# Patient Record
Sex: Female | Born: 1990 | Hispanic: Yes | Marital: Married | Smoking: Never smoker
Health system: Southern US, Community
[De-identification: ages and names within clinical notes are randomized; demographics above are authoritative.]

## PROBLEM LIST (undated history)

## (undated) DIAGNOSIS — F32A Depression, unspecified: Secondary | ICD-10-CM

## (undated) DIAGNOSIS — O009 Unspecified ectopic pregnancy without intrauterine pregnancy: Secondary | ICD-10-CM

## (undated) DIAGNOSIS — N801 Endometriosis of ovary: Secondary | ICD-10-CM

## (undated) DIAGNOSIS — T4145XA Adverse effect of unspecified anesthetic, initial encounter: Secondary | ICD-10-CM

## (undated) DIAGNOSIS — T8859XA Other complications of anesthesia, initial encounter: Secondary | ICD-10-CM

## (undated) DIAGNOSIS — K219 Gastro-esophageal reflux disease without esophagitis: Secondary | ICD-10-CM

## (undated) DIAGNOSIS — D649 Anemia, unspecified: Secondary | ICD-10-CM

## (undated) DIAGNOSIS — Z9889 Other specified postprocedural states: Secondary | ICD-10-CM

## (undated) DIAGNOSIS — N80109 Endometriosis of ovary, unspecified side, unspecified depth: Secondary | ICD-10-CM

## (undated) DIAGNOSIS — N809 Endometriosis, unspecified: Secondary | ICD-10-CM

## (undated) DIAGNOSIS — M419 Scoliosis, unspecified: Secondary | ICD-10-CM

## (undated) HISTORY — DX: Endometriosis of ovary: N80.1

## (undated) HISTORY — DX: Endometriosis of ovary, unspecified side, unspecified depth: N80.109

## (undated) HISTORY — DX: Gastro-esophageal reflux disease without esophagitis: K21.9

---
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
## 2015-09-01 HISTORY — PX: LAPAROTOMY: SHX154

## 2015-12-22 DIAGNOSIS — N979 Female infertility, unspecified: Secondary | ICD-10-CM | POA: Insufficient documentation

## 2016-02-01 HISTORY — PX: ECTOPIC PREGNANCY SURGERY: SHX613

## 2016-02-11 DIAGNOSIS — O009 Unspecified ectopic pregnancy without intrauterine pregnancy: Secondary | ICD-10-CM | POA: Insufficient documentation

## 2016-08-31 HISTORY — PX: LAPAROTOMY: SHX154

## 2017-03-07 DIAGNOSIS — F3341 Major depressive disorder, recurrent, in partial remission: Secondary | ICD-10-CM | POA: Insufficient documentation

## 2017-03-07 DIAGNOSIS — F325 Major depressive disorder, single episode, in full remission: Secondary | ICD-10-CM | POA: Insufficient documentation

## 2017-03-14 DIAGNOSIS — K219 Gastro-esophageal reflux disease without esophagitis: Secondary | ICD-10-CM | POA: Insufficient documentation

## 2017-06-01 ENCOUNTER — Other Ambulatory Visit: Payer: Self-pay

## 2017-06-01 ENCOUNTER — Encounter: Payer: Self-pay | Admitting: Emergency Medicine

## 2017-06-01 ENCOUNTER — Emergency Department: Payer: Medicaid Other

## 2017-06-01 DIAGNOSIS — N809 Endometriosis, unspecified: Secondary | ICD-10-CM | POA: Diagnosis not present

## 2017-06-01 DIAGNOSIS — Z79899 Other long term (current) drug therapy: Secondary | ICD-10-CM | POA: Insufficient documentation

## 2017-06-01 DIAGNOSIS — K219 Gastro-esophageal reflux disease without esophagitis: Secondary | ICD-10-CM | POA: Diagnosis not present

## 2017-06-01 DIAGNOSIS — R102 Pelvic and perineal pain: Secondary | ICD-10-CM | POA: Insufficient documentation

## 2017-06-01 DIAGNOSIS — O008 Other ectopic pregnancy without intrauterine pregnancy: Secondary | ICD-10-CM | POA: Diagnosis not present

## 2017-06-01 DIAGNOSIS — O209 Hemorrhage in early pregnancy, unspecified: Secondary | ICD-10-CM | POA: Diagnosis present

## 2017-06-01 LAB — CBC WITH DIFFERENTIAL/PLATELET
BASOS ABS: 0 10*3/uL (ref 0–0.1)
BASOS PCT: 1 %
Eosinophils Absolute: 0.2 10*3/uL (ref 0–0.7)
Eosinophils Relative: 2 %
HEMATOCRIT: 35.7 % (ref 35.0–47.0)
Hemoglobin: 11.9 g/dL — ABNORMAL LOW (ref 12.0–16.0)
Lymphocytes Relative: 36 %
Lymphs Abs: 2.9 10*3/uL (ref 1.0–3.6)
MCH: 28.5 pg (ref 26.0–34.0)
MCHC: 33.3 g/dL (ref 32.0–36.0)
MCV: 85.6 fL (ref 80.0–100.0)
Monocytes Absolute: 0.6 10*3/uL (ref 0.2–0.9)
Monocytes Relative: 7 %
NEUTROS ABS: 4.4 10*3/uL (ref 1.4–6.5)
NEUTROS PCT: 54 %
PLATELETS: 323 10*3/uL (ref 150–440)
RBC: 4.17 MIL/uL (ref 3.80–5.20)
RDW: 14.7 % — AB (ref 11.5–14.5)
WBC: 8.1 10*3/uL (ref 3.6–11.0)

## 2017-06-01 LAB — URINALYSIS, COMPLETE (UACMP) WITH MICROSCOPIC
Bacteria, UA: NONE SEEN
Bilirubin Urine: NEGATIVE
GLUCOSE, UA: NEGATIVE mg/dL
HGB URINE DIPSTICK: NEGATIVE
Ketones, ur: NEGATIVE mg/dL
LEUKOCYTES UA: NEGATIVE
NITRITE: NEGATIVE
Protein, ur: NEGATIVE mg/dL
SPECIFIC GRAVITY, URINE: 1.013 (ref 1.005–1.030)
pH: 6 (ref 5.0–8.0)

## 2017-06-01 LAB — BASIC METABOLIC PANEL
ANION GAP: 7 (ref 5–15)
BUN: 8 mg/dL (ref 6–20)
CALCIUM: 8.9 mg/dL (ref 8.9–10.3)
CO2: 26 mmol/L (ref 22–32)
Chloride: 104 mmol/L (ref 101–111)
Creatinine, Ser: 0.43 mg/dL — ABNORMAL LOW (ref 0.44–1.00)
GLUCOSE: 97 mg/dL (ref 65–99)
POTASSIUM: 3.3 mmol/L — AB (ref 3.5–5.1)
SODIUM: 137 mmol/L (ref 135–145)

## 2017-06-01 LAB — POCT PREGNANCY, URINE: Preg Test, Ur: POSITIVE — AB

## 2017-06-01 NOTE — ED Triage Notes (Signed)
Patient ambulatory to triage with steady gait, without difficulty or distress noted; pt reports right lower abd pain with vag spotting; UTI 2wks ago; approx [redacted]wks pregnant; G2, ectopic pregnancy last yr with left tube removal

## 2017-06-02 ENCOUNTER — Encounter: Payer: Self-pay | Admitting: Anesthesiology

## 2017-06-02 ENCOUNTER — Emergency Department: Payer: Medicaid Other | Admitting: Anesthesiology

## 2017-06-02 ENCOUNTER — Encounter
Admission: EM | Disposition: A | Discharge status: Home / Self Care | Payer: Self-pay | Source: Home / Self Care | Attending: Emergency Medicine

## 2017-06-02 ENCOUNTER — Emergency Department
Admission: EM | Admit: 2017-06-02 | Discharge: 2017-06-02 | Disposition: A | Discharge status: Home / Self Care | Payer: Medicaid Other | Attending: Emergency Medicine | Admitting: Emergency Medicine

## 2017-06-02 ENCOUNTER — Emergency Department: Payer: Medicaid Other

## 2017-06-02 DIAGNOSIS — T819XXA Unspecified complication of procedure, initial encounter: Secondary | ICD-10-CM

## 2017-06-02 DIAGNOSIS — R102 Pelvic and perineal pain: Secondary | ICD-10-CM

## 2017-06-02 DIAGNOSIS — O209 Hemorrhage in early pregnancy, unspecified: Secondary | ICD-10-CM

## 2017-06-02 DIAGNOSIS — O008 Other ectopic pregnancy without intrauterine pregnancy: Secondary | ICD-10-CM

## 2017-06-02 HISTORY — DX: Endometriosis, unspecified: N80.9

## 2017-06-02 HISTORY — PX: LAPAROTOMY: SHX154

## 2017-06-02 HISTORY — PX: DIAGNOSTIC LAPAROSCOPY WITH REMOVAL OF ECTOPIC PREGNANCY: SHX6449

## 2017-06-02 LAB — HEPATIC FUNCTION PANEL
ALBUMIN: 4.6 g/dL (ref 3.5–5.0)
ALT: 13 U/L — ABNORMAL LOW (ref 14–54)
AST: 24 U/L (ref 15–41)
Alkaline Phosphatase: 37 U/L — ABNORMAL LOW (ref 38–126)
BILIRUBIN TOTAL: 0.6 mg/dL (ref 0.3–1.2)
Bilirubin, Direct: <0.1 mg/dL — ABNORMAL LOW (ref 0.1–0.5)
Total Protein: 7.5 g/dL (ref 6.5–8.1)

## 2017-06-02 LAB — ABO/RH: ABO/RH(D): A POS

## 2017-06-02 LAB — HCG, QUANTITATIVE, PREGNANCY: hCG, Beta Chain, Quant, S: 4072 m[IU]/mL — ABNORMAL HIGH (ref <5)

## 2017-06-02 SURGERY — LAPAROSCOPY, WITH ECTOPIC PREGNANCY SURGICAL TREATMENT
Anesthesia: General | Wound class: Clean Contaminated

## 2017-06-02 MED ORDER — LACTATED RINGERS IV SOLN: INTRAVENOUS | Status: DC

## 2017-06-02 MED ORDER — SUGAMMADEX SODIUM 200 MG/2ML IV SOLN
INTRAVENOUS | Status: AC
  Filled 2017-06-02: qty 2

## 2017-06-02 MED ORDER — MIDAZOLAM HCL 2 MG/2ML IJ SOLN
INTRAMUSCULAR | Status: AC
  Filled 2017-06-02: qty 2

## 2017-06-02 MED ORDER — EVICEL 5 ML EX KIT
PACK | CUTANEOUS | Status: DC | PRN
  Administered 2017-06-02: 5 mL

## 2017-06-02 MED ORDER — GABAPENTIN 300 MG PO CAPS
600.0000 mg | ORAL_CAPSULE | ORAL | Status: DC
  Filled 2017-06-02: qty 2

## 2017-06-02 MED ORDER — FENTANYL CITRATE (PF) 100 MCG/2ML IJ SOLN
INTRAMUSCULAR | Status: DC | PRN
  Administered 2017-06-02 (×4): 50 ug via INTRAVENOUS

## 2017-06-02 MED ORDER — ONDANSETRON HCL 4 MG/2ML IJ SOLN
INTRAMUSCULAR | Status: AC
  Filled 2017-06-02: qty 2

## 2017-06-02 MED ORDER — ONDANSETRON HCL 4 MG/2ML IJ SOLN
INTRAMUSCULAR | Status: DC | PRN
  Administered 2017-06-02: 4 mg via INTRAVENOUS

## 2017-06-02 MED ORDER — OXYCODONE HCL 5 MG PO TABS: 5.0000 mg | ORAL_TABLET | Freq: Once | ORAL | Status: DC | PRN

## 2017-06-02 MED ORDER — FENTANYL CITRATE (PF) 100 MCG/2ML IJ SOLN
INTRAMUSCULAR | Status: AC
  Administered 2017-06-02: 50 ug via INTRAVENOUS
  Filled 2017-06-02: qty 2

## 2017-06-02 MED ORDER — MORPHINE SULFATE (PF) 4 MG/ML IV SOLN: 1.0000 mg | INTRAVENOUS | Status: DC | PRN

## 2017-06-02 MED ORDER — LIDOCAINE HCL (PF) 2 % IJ SOLN
INTRAMUSCULAR | Status: AC
  Filled 2017-06-02: qty 10

## 2017-06-02 MED ORDER — BUPIVACAINE LIPOSOME 1.3 % IJ SUSP
INTRAMUSCULAR | Status: AC
  Filled 2017-06-02: qty 20

## 2017-06-02 MED ORDER — MIDAZOLAM HCL 2 MG/2ML IJ SOLN
INTRAMUSCULAR | Status: DC | PRN
  Administered 2017-06-02: 2 mg via INTRAVENOUS

## 2017-06-02 MED ORDER — KETOROLAC TROMETHAMINE 30 MG/ML IJ SOLN
INTRAMUSCULAR | Status: DC | PRN
  Administered 2017-06-02: 15 mg via INTRAVENOUS

## 2017-06-02 MED ORDER — CEFAZOLIN SODIUM-DEXTROSE 2-4 GM/100ML-% IV SOLN
2.0000 g | Freq: Three times a day (TID) | INTRAVENOUS | Status: DC
  Administered 2017-06-02: 2 g via INTRAVENOUS
  Filled 2017-06-02 (×2): qty 100

## 2017-06-02 MED ORDER — PROPOFOL 10 MG/ML IV BOLUS
INTRAVENOUS | Status: DC | PRN
  Administered 2017-06-02: 150 mg via INTRAVENOUS

## 2017-06-02 MED ORDER — OXYCODONE HCL 5 MG PO TABS: 5.0000 mg | ORAL_TABLET | Freq: Three times a day (TID) | ORAL | 0 refills | Status: DC | PRN

## 2017-06-02 MED ORDER — ROCURONIUM BROMIDE 100 MG/10ML IV SOLN
INTRAVENOUS | Status: DC | PRN
  Administered 2017-06-02: 10 mg via INTRAVENOUS
  Administered 2017-06-02: 40 mg via INTRAVENOUS
  Administered 2017-06-02 (×2): 25 mg via INTRAVENOUS

## 2017-06-02 MED ORDER — VASOPRESSIN 20 UNIT/ML IV SOLN
INTRAVENOUS | Status: AC
  Filled 2017-06-02: qty 1

## 2017-06-02 MED ORDER — VASOPRESSIN 20 UNIT/ML IJ SOLN
INTRAMUSCULAR | Status: DC | PRN
  Administered 2017-06-02: 20 [IU]

## 2017-06-02 MED ORDER — ONDANSETRON HCL 4 MG/2ML IJ SOLN
4.0000 mg | Freq: Once | INTRAMUSCULAR | Status: AC
  Administered 2017-06-02: 4 mg via INTRAVENOUS

## 2017-06-02 MED ORDER — MIDAZOLAM HCL 2 MG/2ML IJ SOLN
1.0000 mg | Freq: Once | INTRAMUSCULAR | Status: AC
  Administered 2017-06-02: 1 mg via INTRAVENOUS

## 2017-06-02 MED ORDER — KETOROLAC TROMETHAMINE 30 MG/ML IJ SOLN
INTRAMUSCULAR | Status: AC
  Filled 2017-06-02: qty 1

## 2017-06-02 MED ORDER — SEVOFLURANE IN SOLN
RESPIRATORY_TRACT | Status: AC
  Filled 2017-06-02: qty 250

## 2017-06-02 MED ORDER — LIDOCAINE HCL (CARDIAC) PF 100 MG/5ML IV SOSY
PREFILLED_SYRINGE | INTRAVENOUS | Status: DC | PRN
  Administered 2017-06-02: 100 mg via INTRAVENOUS

## 2017-06-02 MED ORDER — PHENYLEPHRINE HCL 10 MG/ML IJ SOLN
INTRAMUSCULAR | Status: DC | PRN
  Administered 2017-06-02: 100 ug via INTRAVENOUS

## 2017-06-02 MED ORDER — LACTATED RINGERS IV SOLN
INTRAVENOUS | Status: DC | PRN
  Administered 2017-06-02 (×2): via INTRAVENOUS

## 2017-06-02 MED ORDER — DEXAMETHASONE SODIUM PHOSPHATE 10 MG/ML IJ SOLN
INTRAMUSCULAR | Status: AC
  Filled 2017-06-02: qty 1

## 2017-06-02 MED ORDER — ACETAMINOPHEN 10 MG/ML IV SOLN
INTRAVENOUS | Status: AC
  Filled 2017-06-02: qty 100

## 2017-06-02 MED ORDER — DEXAMETHASONE SODIUM PHOSPHATE 10 MG/ML IJ SOLN: 4.0000 mg | INTRAMUSCULAR | Status: DC

## 2017-06-02 MED ORDER — HYDROMORPHONE HCL 1 MG/ML IJ SOLN
INTRAMUSCULAR | Status: AC
  Administered 2017-06-02: 0.5 mg via INTRAVENOUS
  Filled 2017-06-02: qty 1

## 2017-06-02 MED ORDER — HYDROMORPHONE HCL 1 MG/ML IJ SOLN
0.5000 mg | INTRAMUSCULAR | Status: DC | PRN
  Administered 2017-06-02 (×2): 0.5 mg via INTRAVENOUS

## 2017-06-02 MED ORDER — IBUPROFEN 800 MG PO TABS: 800.0000 mg | ORAL_TABLET | Freq: Four times a day (QID) | ORAL | 0 refills | Status: DC

## 2017-06-02 MED ORDER — FENTANYL CITRATE (PF) 100 MCG/2ML IJ SOLN
25.0000 ug | INTRAMUSCULAR | Status: DC | PRN
  Administered 2017-06-02 (×3): 50 ug via INTRAVENOUS

## 2017-06-02 MED ORDER — SUGAMMADEX SODIUM 200 MG/2ML IV SOLN
INTRAVENOUS | Status: DC | PRN
  Administered 2017-06-02: 121.6 mg via INTRAVENOUS

## 2017-06-02 MED ORDER — OXYCODONE HCL 5 MG/5ML PO SOLN: 5.0000 mg | Freq: Once | ORAL | Status: DC | PRN

## 2017-06-02 MED ORDER — DEXAMETHASONE SODIUM PHOSPHATE 10 MG/ML IJ SOLN
INTRAMUSCULAR | Status: DC | PRN
  Administered 2017-06-02: 10 mg via INTRAVENOUS

## 2017-06-02 MED ORDER — PHENYLEPHRINE HCL 10 MG/ML IJ SOLN
INTRAMUSCULAR | Status: AC
  Filled 2017-06-02: qty 1

## 2017-06-02 MED ORDER — CELECOXIB 200 MG PO CAPS
400.0000 mg | ORAL_CAPSULE | ORAL | Status: AC
  Administered 2017-06-02: 400 mg via ORAL
  Filled 2017-06-02: qty 2

## 2017-06-02 MED ORDER — GABAPENTIN 600 MG PO TABS
ORAL_TABLET | ORAL | Status: AC
  Administered 2017-06-02: 600 mg
  Filled 2017-06-02: qty 1

## 2017-06-02 MED ORDER — FENTANYL CITRATE (PF) 100 MCG/2ML IJ SOLN
INTRAMUSCULAR | Status: AC
  Filled 2017-06-02: qty 2

## 2017-06-02 MED ORDER — ACETAMINOPHEN 500 MG PO TABS
1000.0000 mg | ORAL_TABLET | ORAL | Status: AC
  Administered 2017-06-02: 1000 mg via ORAL
  Filled 2017-06-02: qty 2

## 2017-06-02 MED ORDER — ACETAMINOPHEN 10 MG/ML IV SOLN
INTRAVENOUS | Status: DC | PRN
  Administered 2017-06-02: 1000 mg via INTRAVENOUS

## 2017-06-02 MED ORDER — SODIUM CHLORIDE 0.9 % IJ SOLN
INTRAMUSCULAR | Status: AC
  Filled 2017-06-02: qty 50

## 2017-06-02 MED ORDER — ROCURONIUM BROMIDE 50 MG/5ML IV SOLN
INTRAVENOUS | Status: AC
  Filled 2017-06-02: qty 1

## 2017-06-02 MED ORDER — PROPOFOL 10 MG/ML IV BOLUS
INTRAVENOUS | Status: AC
  Filled 2017-06-02: qty 20

## 2017-06-02 MED ORDER — BUPIVACAINE HCL (PF) 0.5 % IJ SOLN
INTRAMUSCULAR | Status: AC
  Filled 2017-06-02: qty 30

## 2017-06-02 SURGICAL SUPPLY — 73 items
ANCHOR TIS RET SYS 235ML (MISCELLANEOUS) IMPLANT
BACTOSHIELD CHG 4% 4OZ (MISCELLANEOUS) ×2
BAG URINE DRAIN UROCATCH STRL (MISCELLANEOUS) ×4 IMPLANT
BAG URINE DRAINAGE (UROLOGICAL SUPPLIES) IMPLANT
BLADE SURG SZ11 CARB STEEL (BLADE) ×4 IMPLANT
CANISTER SUCT 1200ML W/VALVE (MISCELLANEOUS) ×4 IMPLANT
CATH FOLEY 2WAY  5CC 16FR (CATHETERS) ×2
CATH URTH 16FR FL 2W BLN LF (CATHETERS) ×2 IMPLANT
CHLORAPREP W/TINT 26ML (MISCELLANEOUS) ×4 IMPLANT
CLOSURE WOUND 1/2 X4 (GAUZE/BANDAGES/DRESSINGS)
DERMABOND ADVANCED (GAUZE/BANDAGES/DRESSINGS)
DERMABOND ADVANCED .7 DNX12 (GAUZE/BANDAGES/DRESSINGS) IMPLANT
DEVICE SUTURE ENDOST 10MM (ENDOMECHANICALS) ×4 IMPLANT
DEVICE TROCAR PUNCTURE CLOSURE (ENDOMECHANICALS) IMPLANT
DRAPE LAPAROTOMY 100X77 ABD (DRAPES) IMPLANT
DRAPE LAPAROTOMY TRNSV 106X77 (MISCELLANEOUS) IMPLANT
DRAPE LEGGINS SURG 28X43 STRL (DRAPES) ×4 IMPLANT
DRAPE UNDER BUTTOCK W/FLU (DRAPES) ×4 IMPLANT
DRSG TELFA 3X8 NADH (GAUZE/BANDAGES/DRESSINGS) IMPLANT
ELECT BLADE 6 FLAT ULTRCLN (ELECTRODE) IMPLANT
ELECT CAUTERY BLADE 6.4 (BLADE) IMPLANT
ELECT REM PT RETURN 9FT ADLT (ELECTROSURGICAL) ×4
ELECTRODE REM PT RTRN 9FT ADLT (ELECTROSURGICAL) ×2 IMPLANT
EVICEL AIRLESS SPRAY ACCES (MISCELLANEOUS) ×4 IMPLANT
GLOVE PI ORTHOPRO 6.5 (GLOVE) ×4
GLOVE PI ORTHOPRO STRL 6.5 (GLOVE) ×4 IMPLANT
GLOVE SURG SYN 6.5 ES PF (GLOVE) ×16 IMPLANT
GOWN STRL REUS W/ TWL LRG LVL3 (GOWN DISPOSABLE) ×4 IMPLANT
GOWN STRL REUS W/ TWL XL LVL3 (GOWN DISPOSABLE) IMPLANT
GOWN STRL REUS W/TWL LRG LVL3 (GOWN DISPOSABLE) ×4
GOWN STRL REUS W/TWL XL LVL3 (GOWN DISPOSABLE)
GRASPER SUT TROCAR 14GX15 (MISCELLANEOUS) IMPLANT
IRRIGATION STRYKERFLOW (MISCELLANEOUS) ×2 IMPLANT
IRRIGATOR STRYKERFLOW (MISCELLANEOUS) ×4
IV LACTATED RINGERS 1000ML (IV SOLUTION) ×4 IMPLANT
KIT PINK PAD W/HEAD ARE REST (MISCELLANEOUS) ×4
KIT PINK PAD W/HEAD ARM REST (MISCELLANEOUS) ×2 IMPLANT
KIT TURNOVER CYSTO (KITS) ×4 IMPLANT
LABEL OR SOLS (LABEL) IMPLANT
LIGASURE VESSEL 5MM BLUNT TIP (ELECTROSURGICAL) ×4 IMPLANT
MANIPULATOR UTERINE 4.5 ZUMI (MISCELLANEOUS) ×4 IMPLANT
NEEDLE HYPO 22GX1.5 SAFETY (NEEDLE) ×4 IMPLANT
NS IRRIG 1000ML POUR BTL (IV SOLUTION) IMPLANT
NS IRRIG 500ML POUR BTL (IV SOLUTION) ×4 IMPLANT
PACK BASIN MAJOR ARMC (MISCELLANEOUS) IMPLANT
PACK LAP CHOLECYSTECTOMY (MISCELLANEOUS) ×4 IMPLANT
PAD OB MATERNITY 4.3X12.25 (PERSONAL CARE ITEMS) ×4 IMPLANT
PAD PREP 24X41 OB/GYN DISP (PERSONAL CARE ITEMS) ×4 IMPLANT
POUCH SPECIMEN RETRIEVAL 10MM (ENDOMECHANICALS) IMPLANT
SCISSORS METZENBAUM CVD 33 (INSTRUMENTS) ×4 IMPLANT
SCRUB CHG 4% DYNA-HEX 4OZ (MISCELLANEOUS) ×2 IMPLANT
SLEEVE ENDOPATH XCEL 5M (ENDOMECHANICALS) ×4 IMPLANT
SPONGE LAP 18X18 5 PK (GAUZE/BANDAGES/DRESSINGS) IMPLANT
SPONGE XRAY 4X4 16PLY STRL (MISCELLANEOUS) IMPLANT
STAPLER SKIN PROX 35W (STAPLE) IMPLANT
STRIP CLOSURE SKIN 1/2X4 (GAUZE/BANDAGES/DRESSINGS) IMPLANT
SUT ENDO VLOC 180-0-8IN (SUTURE) ×4 IMPLANT
SUT ETHIBOND CT1 BRD #0 30IN (SUTURE) IMPLANT
SUT MNCRL AB 4-0 PS2 18 (SUTURE) IMPLANT
SUT VIC AB 0 CT1 27 (SUTURE)
SUT VIC AB 0 CT1 27XCR 8 STRN (SUTURE) IMPLANT
SUT VIC AB 0 CT2 27 (SUTURE) ×4 IMPLANT
SUT VIC AB 1 CT1 36 (SUTURE) IMPLANT
SUT VIC AB 2-0 UR6 27 (SUTURE) ×4 IMPLANT
SUT VIC AB 4-0 PS2 18 (SUTURE) IMPLANT
SYR 10ML LL (SYRINGE) ×4 IMPLANT
SYR 30ML LL (SYRINGE) ×4 IMPLANT
TIP RIGID 35CM EVICEL (HEMOSTASIS) ×4 IMPLANT
TRAY FOLEY W/METER SILVER 16FR (SET/KITS/TRAYS/PACK) IMPLANT
TRAY PREP VAG/GEN (MISCELLANEOUS) IMPLANT
TROCAR ENDO BLADELESS 11MM (ENDOMECHANICALS) ×4 IMPLANT
TROCAR XCEL NON-BLD 5MMX100MML (ENDOMECHANICALS) ×4 IMPLANT
TUBING INSUFFLATION (TUBING) ×4 IMPLANT

## 2017-06-02 NOTE — Transfer of Care (Signed)
Immediate Anesthesia Transfer of Care Note  Patient: Suzanne Cross  Procedure(s) Performed: DIAGNOSTIC LAPAROSCOPY WITH REMOVAL OF ECTOPIC PREGNANCY (Left ) EXPLORATORY LAPAROTOMY (N/A )  Patient Location: PACU  Anesthesia Type:General  Level of Consciousness: sedated  Airway & Oxygen Therapy: Patient Spontanous Breathing and Patient connected to face mask oxygen  Post-op Assessment: Report given to RN and Post -op Vital signs reviewed and stable  Post vital signs: Reviewed and stable  Last Vitals:  Vitals Value Taken Time  BP 103/54 06/02/2017  9:08 AM  Temp    Pulse 98 06/02/2017  9:09 AM  Resp 16 06/02/2017  9:09 AM  SpO2 100 % 06/02/2017  9:09 AM  Vitals shown include unvalidated device data.  Last Pain:  Vitals:   06/01/17 2312  TempSrc: Oral  PainSc: 6          Complications: No apparent anesthesia complications

## 2017-06-02 NOTE — Progress Notes (Signed)
Pt c/o of nausea and also c/o anxiety. Dr. Marcello Moores called and notified. Dr. Marcello Moores acknowledged and orders received. Rashell Shambaugh E 10:09 AM 06/02/2017

## 2017-06-02 NOTE — Discharge Instructions (Signed)
Discharge instructions:  Call office if you have any of the following: fever >101 F, chills, excessive vaginal bleeding, incision drainage or problems, leg pain or redness, or any other concerns.   Activity: Do not lift > 10 lbs for 4 weeks.  No intercourse until we have confirmed your pregnancy has been removed and your uterus has healed.  No driving until you are certain you can slam on the brakes   You may feel some pain in your upper right abdomen/rib and right shoulder.  This is from the gas in the abdomen for surgery. This will subside over time, please be patient!  Take 600mg  Ibuprofen and 1000mg  Tylenol around the clock, every 6 hours for at least the first 3-5 days.  After this you can take as needed.  This will help decrease inflammation and promote healing.  The narcotics you'll take just as needed, as they just trick your brain into thinking its not in pain.    Please don't limit yourself in terms of routine activity.  You will be able to do most things, although they may take longer to do or be a little painful.  You can do it!  Don't be a hero, but don't be a wimp either!    AMBULATORY SURGERY  DISCHARGE INSTRUCTIONS   1) The drugs that you were given will stay in your system until tomorrow so for the next 24 hours you should not:  A) Drive an automobile B) Make any legal decisions C) Drink any alcoholic beverage   2) You may resume regular meals tomorrow.  Today it is better to start with liquids and gradually work up to solid foods.  You may eat anything you prefer, but it is better to start with liquids, then soup and crackers, and gradually work up to solid foods.   3) Please notify your doctor immediately if you have any unusual bleeding, trouble breathing, redness and pain at the surgery site, drainage, fever, or pain not relieved by medication.    4) Additional Instructions:        Please contact your physician with any problems or Same Day Surgery at  [phone], Monday through Friday 6 am to 4 pm, or [place] at [facility] number at [phone].

## 2017-06-02 NOTE — ED Notes (Signed)
Patient is sitting comfortably on ED stretcher at this time with no signs of distress present. Family remains at bedside. Updated on wait/delay. Will continue to monitor.

## 2017-06-02 NOTE — H&P (Addendum)
Consult History and Physical   SERVICE: Gynecology   Patient Name: Suzanne Cross Patient MRN:   [id number]  CC: vaginal spotting and right sided pelvic pain with known early pregnancy  HPI: Suzanne Cross is a 27 y.o. G2P0 with LMP of 04/27/2017 and history of ectopic pregnancy with left salpingectomy, presents to the ED with positive pregnancy test and right-sided pelvic pain and vaginal spotting.  She had an ectopic pregnancy with a left salpingectomy (02/2016), followed by a torsion of the left ovary which resulted in an oophorectomy (08/2016).  She has known endometriosis, and was quoted as having a 10% chance of fertility.  She was evaluated in the emergency room and by ultrasound, and found to have an abnormal finding consistent with an unruptured interstitial ectopic pregnancy, interestingly, likely on the left.  She is hemodynamically stable. I was consulted to guide clinical management.   Review of Systems: positives in bold GEN:   fevers, chills, weight changes, appetite changes, fatigue, night sweats HEENT:  HA, vision changes, hearing loss, congestion, rhinorrhea, sinus pressure, dysphagia CV:   CP, palpitations PULM:  SOB, cough GI:  abd pain, N/V/D/C GU:  dysuria, urgency, frequency MSK:  arthralgias, myalgias, back pain, swelling SKIN:  rashes, color changes, pallor NEURO:  numbness, weakness, tingling, seizures, dizziness, tremors PSYCH:  depression, anxiety, behavioral problems, confusion  HEME/LYMPH:  easy bruising or bleeding ENDO:  heat/cold intolerance  Past Obstetrical History: G2: ectopic x1 2018, current pregnancy   Past Gynecologic History: Patient's last menstrual period was 04/27/2017 (exact date).  Left salpingectomy Left oophorectomy Endometriosis  Past Medical History: Past Medical History:  Diagnosis Date  . Endometriosis     Past Surgical History:   Past Surgical History:  Procedure Laterality Date  . ECTOPIC PREGNANCY SURGERY       Family History:  No gyn cancers  Social History:  Recently moved here from [place], [place] to be with her partner, Suzanne Cross.   Former [employer]or[employer]  Home Medications:  Prenatal vitamins  Allergies:  No Known Allergies  Physical Exam:  Temp:  [98.6 F (37 C)] 98.6 F (37 C) (05/02 2312) Pulse Rate:  [110] 110 (05/02 2312) Resp:  [18] 18 (05/02 2312) BP: (140)/(87) 140/87 (05/02 2312) SpO2:  [100 %] 100 % (05/02 2312) Weight:  [60.8 kg (134 lb)] 60.8 kg (134 lb) (05/02 2312)   General Appearance:  Well developed, well nourished, no acute distress, alert and oriented, cooperative and appears stated age 30:  Normocephalic atraumatic, extraocular movements intact, moist mucous membranes, neck supple with midline trachea and thyroid without masses Cardiovascular:  Normal S1/S2, regular rate and rhythm, no murmurs, 2+ distal pulses Pulmonary:  clear to auscultation, no wheezes, rales or rhonchi, symmetric air entry, good air exchange Abdomen:  Bowel sounds present, soft, nontender, nondistended, no abnormal masses or organomegaly, no epigastric pain Back: inspection of back is normal Extremities:  extremities normal, no tenderness, atraumatic, no cyanosis or edema Skin:  normal coloration and turgor, no rashes, no suspicious skin lesions noted  Neurologic:  Cranial nerves 2-12 grossly intact, grossly equal strength and muscle tone, normal speech, no focal findings or movement disorder noted. Psychiatric:  Normal mood and affect, appropriate, no AH/VH Pelvic: deferred  Labs/Studies:  Results for orders placed or performed during the hospital encounter of 06/02/17 (from the past 24 hour(s))  Basic metabolic panel     Status: Abnormal   Collection Time: 06/01/17 11:18 PM  Result Value Ref Range   Sodium 137 135 - 145 mmol/L  Potassium 3.3 (L) 3.5 - 5.1 mmol/L   Chloride 104 101 - 111 mmol/L   CO2 26 22 - 32 mmol/L   Glucose, Bld 97 65 - 99 mg/dL   BUN 8 6 - 20 mg/dL   Creatinine,  Ser 0.43 (L) 0.44 - 1.00 mg/dL   Calcium 8.9 8.9 - 10.3 mg/dL   GFR calc non Af Amer >60 >60 mL/min   GFR calc Af Amer >60 >60 mL/min   Anion gap 7 5 - 15  CBC with Differential     Status: Abnormal   Collection Time: 06/01/17 11:18 PM  Result Value Ref Range   WBC 8.1 3.6 - 11.0 K/uL   RBC 4.17 3.80 - 5.20 MIL/uL   Hemoglobin 11.9 (L) 12.0 - 16.0 g/dL   HCT 35.7 35.0 - 47.0 %   MCV 85.6 80.0 - 100.0 fL   MCH 28.5 26.0 - 34.0 pg   MCHC 33.3 32.0 - 36.0 g/dL   RDW 14.7 (H) 11.5 - 14.5 %   Platelets 323 150 - 440 K/uL   Neutrophils Relative % 54 %   Neutro Abs 4.4 1.4 - 6.5 K/uL   Lymphocytes Relative 36 %   Lymphs Abs 2.9 1.0 - 3.6 K/uL   Monocytes Relative 7 %   Monocytes Absolute 0.6 0.2 - 0.9 K/uL   Eosinophils Relative 2 %   Eosinophils Absolute 0.2 0 - 0.7 K/uL   Basophils Relative 1 %   Basophils Absolute 0.0 0 - 0.1 K/uL  hCG, quantitative, pregnancy     Status: Abnormal   Collection Time: 06/01/17 11:18 PM  Result Value Ref Range   hCG, Beta Chain, Quant, S 4,072 (H) <5 mIU/mL  ABO/Rh     Status: None   Collection Time: 06/01/17 11:18 PM  Result Value Ref Range   ABO/RH(D)      A POS Performed at [facility], [address] [place], [place] [address]   Hepatic function panel     Status: Abnormal   Collection Time: 06/01/17 11:18 PM  Result Value Ref Range   Total Protein 7.5 6.5 - 8.1 g/dL   Albumin 4.6 3.5 - 5.0 g/dL   AST 24 15 - 41 U/L   ALT 13 (L) 14 - 54 U/L   Alkaline Phosphatase 37 (L) 38 - 126 U/L   Total Bilirubin 0.6 0.3 - 1.2 mg/dL   Bilirubin, Direct <0.1 (L) 0.1 - 0.5 mg/dL   Indirect Bilirubin NOT CALCULATED 0.3 - 0.9 mg/dL  Urinalysis, Complete w Microscopic     Status: Abnormal   Collection Time: 06/01/17 11:20 PM  Result Value Ref Range   Color, Urine YELLOW (A) YELLOW   APPearance CLEAR (A) CLEAR   Specific Gravity, Urine 1.013 1.005 - 1.030   pH 6.0 5.0 - 8.0   Glucose, UA NEGATIVE NEGATIVE mg/dL   Hgb urine dipstick  NEGATIVE NEGATIVE   Bilirubin Urine NEGATIVE NEGATIVE   Ketones, ur NEGATIVE NEGATIVE mg/dL   Protein, ur NEGATIVE NEGATIVE mg/dL   Nitrite NEGATIVE NEGATIVE   Leukocytes, UA NEGATIVE NEGATIVE   RBC / HPF 0-5 0 - 5 RBC/hpf   WBC, UA 0-5 0 - 5 WBC/hpf   Bacteria, UA NONE SEEN NONE SEEN   Squamous Epithelial / LPF 0-5 0 - 5   Mucus PRESENT   Pregnancy, urine POC     Status: Abnormal   Collection Time: 06/01/17 11:22 PM  Result Value Ref Range   Preg Test, Ur POSITIVE (A) NEGATIVE  TVUS:    US Ob Comp < 14 Wks  Result Date: 06/02/2017 CLINICAL DATA:  Right pelvic pain with spotting history of prior left ectopic with removal of left ovary and fallopian tube EXAM: OBSTETRIC <14 WK [place] AND TRANSVAGINAL OB US TECHNIQUE: Both transabdominal and transvaginal ultrasound examinations were performed for complete evaluation of the gestation as well as the maternal uterus, adnexal regions, and pelvic cul-de-sac. Transvaginal technique was performed to assess early pregnancy. COMPARISON:  None. FINDINGS: Intrauterine gestational sac: Not seen Yolk sac:  Visible in the high left fundal region Embryo:  Not seen Subchorionic hemorrhage:  None visualized. Maternal uterus/adnexae: Right ovary measures 3.9 x 3.1 x 3 cm and contains probable luteal body measuring 2.2 cm. Within the high left uterine fundal region, separate from the endometrial stripe is a mass with echogenic ring, central fluid and small internal ring structure suspicious for ectopic pregnancy with yolk sac. Small amount of free fluid in the pelvis. 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 Critical Value/emergent results were called by telephone at the time of interpretation on 06/02/2017 at 1:16 am to Dr. Darel Hong , who verbally acknowledged these results. Electronically Signed   By: Donavan Foil M.D.   On: 06/02/2017 01:16   US Ob Transvaginal  Result Date: 06/02/2017 CLINICAL DATA:  Right pelvic pain with spotting history of prior left ectopic with removal of left ovary and fallopian tube EXAM: OBSTETRIC <14 WK [place] AND TRANSVAGINAL OB US TECHNIQUE: Both transabdominal and transvaginal ultrasound examinations were performed for complete evaluation of the gestation as well as the maternal uterus, adnexal regions, and pelvic cul-de-sac. Transvaginal technique was performed to assess early pregnancy. COMPARISON:  None. FINDINGS: Intrauterine gestational sac: Not seen Yolk sac:  Visible in the high left fundal region Embryo:  Not seen Subchorionic hemorrhage:  None visualized. Maternal uterus/adnexae: Right ovary measures 3.9 x 3.1 x 3 cm and contains probable luteal body measuring 2.2 cm. Within the high left uterine fundal region, separate from the endometrial stripe is a mass with echogenic ring, central fluid and small internal ring structure suspicious for ectopic pregnancy with yolk sac. Small amount of free fluid in the pelvis. 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 Critical Value/emergent results were called by telephone at the time of interpretation on 06/02/2017 at 1:16 am to Dr. Darel Hong , who verbally acknowledged these results. Electronically Signed   By: Donavan Foil M.D.   On: 06/02/2017 01:16     Assessment / Plan:   Suzanne Cross is a 27 y.o. G2P0 who presents with interstitial ectopic pregnancy  1.  Reviewed imaging in detail with patient and partner. 2.  This is definitely an abnormal pregnancy outside of the endometrium.  Whether this is in a tubal  remnant or in the cornua is undetermined.  The various treatment options were discussed, including multidose methotrexate, direct injection of methotrexate into the ectopic, and surgical exploration with removal of gestational products.  Also discussed were approaches to surgery, including laparoscopy versus laparotomy.  The patient declines methotrexate, and wishes to proceed with surgery. 3.  Risks benefits and alternatives were discussed, including bleeding, infection, damage to nearby structures, fertility reduction, potential for cesarean delivery only depending on extent of myometrial resection, consents were signed, OR notified.  We will proceed to OR when staff is ready.   Thank you for the opportunity to be involved with this patient's care.  ----- Larey Days, MD Attending Obstetrician and Gynecologist [facility], Department of OB/GYN [facility]   >60 minutes were spent with this patient, both face-to-face (>50%) and in coordination of care, reviewing images, and consultation and writing this note.

## 2017-06-02 NOTE — Anesthesia Procedure Notes (Signed)
Procedure Name: Intubation Date/Time: 06/02/2017 6:22 AM Performed by: Aline Brochure, CRNA Pre-anesthesia Checklist: Patient identified, Emergency Drugs available, Suction available and Patient being monitored Patient Re-evaluated:Patient Re-evaluated prior to induction Oxygen Delivery Method: Circle system utilized Preoxygenation: Pre-oxygenation with 100% oxygen Induction Type: IV induction Ventilation: Mask ventilation without difficulty Laryngoscope Size: Mac and 3 Grade View: Grade I Tube type: Oral Tube size: 7.0 mm Number of attempts: 1 Airway Equipment and Method: Stylet Placement Confirmation: ETT inserted through vocal cords under direct vision,  positive ETCO2 and breath sounds checked- equal and bilateral Secured at: 21 cm Tube secured with: Tape Dental Injury: Teeth and Oropharynx as per pre-operative assessment

## 2017-06-02 NOTE — ED Notes (Addendum)
Dr Leonides Schanz reviewing and signing consent with pt at this time. To be included with chart (stickers) and sent up with patient.

## 2017-06-02 NOTE — Progress Notes (Signed)
Pt continues to have pain after 150 mcg Fentanyl. Dr. Marcello Moores notified. Acknowledged. Orders received. Ceira Hoeschen E 10:12 AM 06/02/2017

## 2017-06-02 NOTE — ED Provider Notes (Signed)
[facility] Emergency Department Provider Note  ____________________________________________  Time seen: Approximately 4:41 AM  I have reviewed the triage vital signs and the nursing notes.   HISTORY  Chief Complaint Abdominal Pain    HPI Suzanne Cross is a 27 y.o. female with a history of endometriosis, [redacted] weeks pregnant who complains of pelvic pain and vaginal spotting that started yesterday. Feels similar to the last time she had an ectopic pregnancy that resulted in a left salpingectomy about 2 years ago. Denies chest pain shortness of breath fevers or chills. No unusual vaginal discharge. No dysuria.  Pain is intermittent, non radiating, no aggravating or alleviating factors.      Past Medical History:  Diagnosis Date  . Endometriosis      There are no active problems to display for this patient.    Past Surgical History:  Procedure Laterality Date  . ECTOPIC PREGNANCY SURGERY       Prior to Admission medications   Not on File  none   Allergies Patient has no known allergies.   No family history on file.  Social History Social History   Tobacco Use  . Smoking status: Never Smoker  . Smokeless tobacco: Never Used  Substance Use Topics  . Alcohol use: Not on file  . Drug use: Not on file    Review of Systems  Constitutional:   No fever or chills.  ENT:   No sore throat. No rhinorrhea. Cardiovascular:   No chest pain or syncope. Respiratory:   No dyspnea or cough. Gastrointestinal:  positive as above for pelvic pain without vomiting and diarrhea.  Musculoskeletal:   Negative for focal pain or swelling All other systems reviewed and are negative except as documented above in ROS and HPI.  ____________________________________________   PHYSICAL EXAM:  VITAL SIGNS: ED Triage Vitals [06/01/17 2312]  Enc Vitals Group     BP 140/87     Pulse Rate (!) 110     Resp 18     Temp 98.6 F (37 C)     Temp Source Oral      SpO2 100 %     Weight 134 lb (60.8 kg)     Height 5\' 2"  (1.575 m)     Head Circumference      Peak Flow      Pain Score 6     Pain Loc      Pain Edu?      Excl. in [place]?     Vital signs reviewed, nursing assessments reviewed.   Constitutional:   Alert and oriented. Well appearing and in no distress. Eyes:   Conjunctivae are normal. EOMI. PERRL. ENT      Head:   Normocephalic and atraumatic.          Neck:   No meningismus. Full ROM. Hematological/Lymphatic/Immunilogical:   No cervical lymphadenopathy. Cardiovascular:   RRR. Symmetric bilateral radial and DP pulses.  No murmurs.  Respiratory:   Normal respiratory effort without tachypnea/retractions. Breath sounds are clear and equal bilaterally. No wheezes/rales/rhonchi. Gastrointestinal:   Soft and nontender. Non distended. There is no CVA tenderness.  No rebound, rigidity, or guarding. Musculoskeletal:   Normal range of motion in all extremities. No joint effusions.  No lower extremity tenderness.  No edema. Neurologic:   Normal speech and language.  Motor grossly intact. No acute focal neurologic deficits are appreciated.  Skin:    Skin is warm, dry and intact. No rash noted.  No petechiae, purpura, or bullae.  ____________________________________________    LABS (pertinent positives/negatives) (all labs ordered are listed, but only abnormal results are displayed) Labs Reviewed  BASIC METABOLIC PANEL - Abnormal; Notable for the following components:      Result Value   Potassium 3.3 (*)    Creatinine, Ser 0.43 (*)    All other components within normal limits  CBC WITH DIFFERENTIAL/PLATELET - Abnormal; Notable for the following components:   Hemoglobin 11.9 (*)    RDW 14.7 (*)    All other components within normal limits  HCG, QUANTITATIVE, PREGNANCY - Abnormal; Notable for the following components:   hCG, Beta Chain, Quant, S 4,072 (*)    All other components within normal limits  URINALYSIS, COMPLETE (UACMP) WITH  MICROSCOPIC - Abnormal; Notable for the following components:   Color, Urine YELLOW (*)    APPearance CLEAR (*)    All other components within normal limits  HEPATIC FUNCTION PANEL - Abnormal; Notable for the following components:   ALT 13 (*)    Alkaline Phosphatase 37 (*)    Bilirubin, Direct <0.1 (*)    All other components within normal limits  POCT PREGNANCY, URINE - Abnormal; Notable for the following components:   Preg Test, Ur POSITIVE (*)    All other components within normal limits  ABO/RH   ____________________________________________   EKG    ____________________________________________    RADIOLOGY  US Ob Comp < 14 Wks  Result Date: 06/02/2017 CLINICAL DATA:  Right pelvic pain with spotting history of prior left ectopic with removal of left ovary and fallopian tube EXAM: OBSTETRIC <14 WK [place] AND TRANSVAGINAL OB US TECHNIQUE: Both transabdominal and transvaginal ultrasound examinations were performed for complete evaluation of the gestation as well as the maternal uterus, adnexal regions, and pelvic cul-de-sac. Transvaginal technique was performed to assess early pregnancy. COMPARISON:  None. FINDINGS: Intrauterine gestational sac: Not seen Yolk sac:  Visible in the high left fundal region Embryo:  Not seen Subchorionic hemorrhage:  None visualized. Maternal uterus/adnexae: Right ovary measures 3.9 x 3.1 x 3 cm and contains probable luteal body measuring 2.2 cm. Within the high left uterine fundal region, separate from the endometrial stripe is a mass with echogenic ring, central fluid and small internal ring structure suspicious for ectopic pregnancy with yolk sac. Small amount of free fluid in the pelvis. 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  Critical Value/emergent results were called by telephone at the time of interpretation on 06/02/2017 at 1:16 am to Dr. Darel Hong , who verbally acknowledged these results. Electronically Signed   By: Donavan Foil M.D.   On: 06/02/2017 01:16   US Ob Transvaginal  Result Date: 06/02/2017 CLINICAL DATA:  Right pelvic pain with spotting history of prior left ectopic with removal of left ovary and fallopian tube EXAM: OBSTETRIC <14 WK [place] AND TRANSVAGINAL OB US TECHNIQUE: Both transabdominal and transvaginal ultrasound examinations were performed for complete evaluation of the gestation as well as the maternal uterus, adnexal regions, and pelvic cul-de-sac. Transvaginal technique was performed to assess early pregnancy. COMPARISON:  None. FINDINGS: Intrauterine gestational sac: Not seen Yolk sac:  Visible in the high left fundal region Embryo:  Not seen Subchorionic hemorrhage:  None visualized. Maternal uterus/adnexae: Right ovary measures 3.9 x 3.1 x 3 cm and contains probable luteal body measuring  2.2 cm. Within the high left uterine fundal region, separate from the endometrial stripe is a mass with echogenic ring, central fluid and small internal ring structure suspicious for ectopic pregnancy with yolk sac. Small amount of free fluid in the pelvis. 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 Critical Value/emergent results were called by telephone at the time of interpretation on 06/02/2017 at 1:16 am to Dr. Darel Hong , who verbally acknowledged these results. Electronically Signed   By: Donavan Foil M.D.   On: 06/02/2017 01:16    ____________________________________________   PROCEDURES Procedures  ____________________________________________    CLINICAL IMPRESSION  / ASSESSMENT AND PLAN / ED COURSE  Pertinent labs & imaging results that were available during my care of the patient were reviewed by me and considered in my medical decision making (see chart for details).    patient presents with pain and spotting in first trimester pregnancy. Ultrasound pelvis discussed with the ultrasound tech, appears to show an ectopic pregnancy in the area of the left cornua. No evidence of rupture or shock at this time. Hemodynamically stable. Will consult Ob/gyn.   Clinical Course as of Jun 03 439  Fri Jun 02, 2017  0210 Ectopic discussed with Ob Dr. Leonides Schanz who will come eval. Pt remains hemodynamically stable.   [PS]    Clinical Course User Index [PS] Carrie Mew, MD    ----------------------------------------- 4:46 AM on 06/02/2017 -----------------------------------------  D/w Dr. Leonides Schanz after her eval of pt. Plan to proceed with surgical management.   ____________________________________________   FINAL CLINICAL IMPRESSION(S) / ED DIAGNOSES    Final diagnoses:  Other ectopic pregnancy without intrauterine pregnancy  Pelvic pain in female  Vaginal bleeding in pregnancy, first trimester     ED Discharge Orders    None      Portions of this note were generated with dragon dictation software. Dictation errors may occur despite best attempts at proofreading.    Carrie Mew, MD 06/02/17 [phone]

## 2017-06-02 NOTE — Anesthesia Preprocedure Evaluation (Signed)
Anesthesia Evaluation  Patient identified by MRN, date of birth, ID band Patient awake    Reviewed: Allergy & Precautions, H&P , NPO status , Patient's Chart, lab work & pertinent test results  History of Anesthesia Complications (+) history of anesthetic complications ( "low blood pressure after anesthesia")  Airway Mallampati: I  TM Distance: >3 FB Neck ROM: full    Dental  (+) Chipped   Pulmonary neg pulmonary ROS, neg shortness of breath,           Cardiovascular   "blood pressure runs low"   Neuro/Psych negative neurological ROS  negative psych ROS   GI/Hepatic negative GI ROS, Neg liver ROS, neg GERD  ,  Endo/Other  negative endocrine ROS  Renal/GU      Musculoskeletal   Abdominal   Peds  Hematology negative hematology ROS (+)   Anesthesia Other Findings Past Medical History: No date: Endometriosis  Past Surgical History: No date: ECTOPIC PREGNANCY SURGERY  BMI    Body Mass Index:  24.51 kg/m      Reproductive/Obstetrics negative OB ROS                             Anesthesia Physical Anesthesia Plan  ASA: II  Anesthesia Plan: General ETT   Post-op Pain Management:    Induction: Intravenous  PONV Risk Score and Plan: Ondansetron, Dexamethasone and Midazolam  Airway Management Planned: Oral ETT  Additional Equipment:   Intra-op Plan:   Post-operative Plan: Extubation in OR  Informed Consent: I have reviewed the patients History and Physical, chart, labs and discussed the procedure including the risks, benefits and alternatives for the proposed anesthesia with the patient or authorized representative who has indicated his/her understanding and acceptance.   Dental Advisory Given  Plan Discussed with: Anesthesiologist, CRNA and Surgeon  Anesthesia Plan Comments: (Patient consented for risks of anesthesia including but not limited to:  - adverse reactions to  medications - damage to teeth, lips or other oral mucosa - sore throat or hoarseness - Damage to heart, brain, lungs or loss of life  Patient voiced understanding.)        Anesthesia Quick Evaluation

## 2017-06-02 NOTE — Anesthesia Post-op Follow-up Note (Signed)
Anesthesia QCDR form completed.        

## 2017-06-02 NOTE — ED Notes (Signed)
Dr. Ward at bedside.

## 2017-06-02 NOTE — Anesthesia Postprocedure Evaluation (Signed)
Anesthesia Post Note  Patient: Suzanne Cross  Procedure(s) Performed: DIAGNOSTIC LAPAROSCOPY WITH REMOVAL OF ECTOPIC PREGNANCY (Left ) EXPLORATORY LAPAROTOMY (N/A )  Patient location during evaluation: PACU Anesthesia Type: General Level of consciousness: awake and alert Pain management: pain level controlled Vital Signs Assessment: post-procedure vital signs reviewed and stable Respiratory status: spontaneous breathing, nonlabored ventilation, respiratory function stable and patient connected to nasal cannula oxygen Cardiovascular status: blood pressure returned to baseline and stable Postop Assessment: no apparent nausea or vomiting Anesthetic complications: no     Last Vitals:  Vitals:   06/02/17 1039 06/02/17 1114  BP: 108/74 (!) 100/56  Pulse: (!) 112 96  Resp: 16 16  Temp: 37.2 C   SpO2: 100% 99%    Last Pain:  Vitals:   06/02/17 1114  TempSrc:   PainSc: [place]

## 2017-06-03 NOTE — Op Note (Signed)
Suzanne Cross PROCEDURE DATE: 06/02/2017  PATIENT:  Suzanne Cross  27 y.o. female  PRE-OPERATIVE DIAGNOSIS:  possible left cornual ectopic  POST-OPERATIVE DIAGNOSIS:  Same, stage 1 endometriosis  PROCEDURE:  Procedure(s): DIAGNOSTIC LAPAROSCOPY WITH REMOVAL OF ECTOPIC PREGNANCY (Left) RESECTION OF UTERUS (LEFT)  SURGEON:  Surgeon(s) and Role:    * Levonne Carreras, Honor Loh, MD - Primary Assisting: Benjaman Kindler, MD  ANESTHESIA:  General via ET  I/O  Total I/O In: 4128 [P.O.:25; I.V.:1650] Out: 425 [Urine:400; Blood:25]  FINDINGS: Bulbous retroverted uterus with limited mobility, normal appearing right tube and ovary. Absent left tube and ovary. Normal upper abdomen. Endometrial implants on left round ligament, left ovarian fossa, posterior culdesac, and on diffuse peritoneal surfaces.  No scarring and no endometriomas.  One black hemosiderin vesicle on left posterior broad ligament.  SPECIMEN: left uterine cornua with ectopic pregnacy - sent to pathology.  Portion of this was intraoperatively submerged into fluid with finding of floating villi.   COMPLICATIONS: none apparent  DISPOSITION: vital signs stable to PACU   Indication for Surgery: 27 y.o. G2P0 with history of surgically removed left adnexa presents with vaginal spotting, right-sided pelvic pain, 5 week pregnancy by LMP, and clear ultrasound evidence of gestational sac in LEFT cornua.  Medical and surgical management was offered, and patient elected for surgical.   Risks of surgery were discussed with the patient including but not limited to: bleeding which may require transfusion or reoperation; infection which may require antibiotics; injury to bowel, bladder, ureters or other surrounding organs; need for additional procedures including laparotomy, blood clot, incisional problems and other postoperative/anesthesia complications. Written informed consent was obtained.     PROCEDURE IN DETAIL:  The patient had sequential  compression devices applied to her lower extremities while in the preoperative area.  She was then taken to the operating room where general anesthesia was administered and was found to be adequate.  She was placed in the dorsal lithotomy position, and was prepped and draped in a sterile manner.  A Foley catheter was inserted into her bladder and attached to constant drainage and a uterine manipulator was then advanced into the uterus .  After a surgical timeout was performed, attention was turned to the abdomen where an umbilical incision was made with the scalpel. A 24mm trochar was inserted in the umbilical incision using a visiport method. Opening pressure was 68mmHg, and the abdomen was insufflated to 15mg Hg carbon dioxide gas and adequate pneumoperitoneum was obtained.  A survey of the patient's pelvis and abdomen revealed the findings as mentioned above. Two 54mm ports were inserted in the lower left and right quadrants under visualization.    10u vasopressin in 33ml saline was injected into the left cornua after identifying the bulge in the area with the extracapsular tissue.  The Enseal device was then used to seal and divide the bulbous tissue across the fundus to the round ligament.  The area was hemostatic, and the removed tissue was extracted through the port and marked to be sent to pathology. The 88mm LLQ port was exchanged for an 45mm port, and the Endostitch with V-Lock suture was used to close the tissue at the cornua; two sutures were used in 2 layers, in opposing directions.  The cornua was then coated with Evaseal.  Upon removing the tissue, there was another bulbous finding just beneath the round ligament, more rounded, and intimate with the uterine vessels.  At this time, the ultrasound was reviewed to note location of the gestational sac  in relation to the fundus, as well as ultrasound was asked to come into the room for a repeat transvaginal ultrasound to confirm evacuation of the  prior-seen gestational sac.  On ultrasound there was NO gestational sac seen.  Then, the tissue obtained from the cornual resection was submerged in saline and the fronds of the villi were seen floating.  At this time, the evidence was that the gestational tissue had been removed.   The operative site was surveyed, and it was found to be hemostatic.  No intraoperative injury to surrounding organs was noted. The 52mm port site was closed with 2-0 vicryl using the inlet closure device. The abdomen was desufflated and all instruments were then removed from the patient's abdomen. The uterine manipulator was removed without complications.  All skin incisions were closed with 4-0 monocryl and covered with surgical glue. The patient tolerated the procedures well.  All instruments, needles, and sponge counts were correct x 2. The patient was taken to the recovery room in stable condition.   ---- Larey Days, MD Attending Obstetrician and [facility]

## 2017-06-05 LAB — SURGICAL PATHOLOGY

## 2017-06-19 DIAGNOSIS — Z8759 Personal history of other complications of pregnancy, childbirth and the puerperium: Secondary | ICD-10-CM | POA: Insufficient documentation

## 2017-06-28 ENCOUNTER — Encounter: Payer: Medicaid Other | Admitting: Family Medicine

## 2017-07-27 DIAGNOSIS — H5213 Myopia, bilateral: Secondary | ICD-10-CM | POA: Diagnosis not present

## 2017-08-29 DIAGNOSIS — H5213 Myopia, bilateral: Secondary | ICD-10-CM | POA: Diagnosis not present

## 2017-09-06 ENCOUNTER — Encounter (INDEPENDENT_AMBULATORY_CARE_PROVIDER_SITE_OTHER): Payer: Self-pay

## 2017-09-06 ENCOUNTER — Ambulatory Visit: Payer: Medicaid Other | Admitting: Family Medicine

## 2017-09-06 ENCOUNTER — Encounter: Payer: Self-pay | Admitting: Family Medicine

## 2017-09-06 VITALS — BP 130/78 | HR 104 | Temp 98.3°F | Resp 18 | Ht 62.0 in | Wt 145.7 lb

## 2017-09-06 DIAGNOSIS — Z23 Encounter for immunization: Secondary | ICD-10-CM | POA: Diagnosis not present

## 2017-09-06 DIAGNOSIS — N809 Endometriosis, unspecified: Secondary | ICD-10-CM

## 2017-09-06 DIAGNOSIS — E876 Hypokalemia: Secondary | ICD-10-CM

## 2017-09-06 DIAGNOSIS — Z130 Encounter for screening for diseases of the blood and blood-forming organs and certain disorders involving the immune mechanism: Secondary | ICD-10-CM

## 2017-09-06 DIAGNOSIS — E663 Overweight: Secondary | ICD-10-CM

## 2017-09-06 DIAGNOSIS — M4124 Other idiopathic scoliosis, thoracic region: Secondary | ICD-10-CM

## 2017-09-06 DIAGNOSIS — Z131 Encounter for screening for diabetes mellitus: Secondary | ICD-10-CM | POA: Diagnosis not present

## 2017-09-06 DIAGNOSIS — D649 Anemia, unspecified: Secondary | ICD-10-CM | POA: Diagnosis not present

## 2017-09-06 DIAGNOSIS — O99210 Obesity complicating pregnancy, unspecified trimester: Secondary | ICD-10-CM | POA: Insufficient documentation

## 2017-09-06 DIAGNOSIS — Z13220 Encounter for screening for lipoid disorders: Secondary | ICD-10-CM

## 2017-09-06 MED ORDER — CYCLOBENZAPRINE HCL 5 MG PO TABS: 5.0000 mg | ORAL_TABLET | Freq: Every evening | ORAL | 1 refills | Status: DC | PRN

## 2017-09-06 NOTE — Progress Notes (Signed)
Name: Suzanne Cross   MRN: [id number]    DOB: December 07, 1990   Date:09/06/2017       Progress Note  Subjective  Chief Complaint  Chief Complaint  Patient presents with  . Establish Care  . Referral    GYN- Dr. Leonides Schanz at [facility]    HPI  Endometriosis: she had an ectopic pregnancy in 2017 and was laparoscopic surgery showed endometriosis. She has heavy and painful cycles. Diagnosed in [place], moved to [place] and had another ectopic 05/2017, currently under the care of Dr. Leonides Schanz  Scoliosis: she was diagnosed as a child , she used to get manipulation from chiropractor in [place], she continues to have spasms and would like referral, but explained that PT or chiropractor not covered by Medicaid in [place]. We will give her some flexeril to take prn at night for muscle spasms   Patient Active Problem List   Diagnosis Date Noted  . Endometriosis 09/06/2017  . Other idiopathic scoliosis, thoracic region 09/06/2017  . Overweight (BMI 25.0-29.9) 09/06/2017  . History of ectopic pregnancy 06/19/2017    Past Surgical History:  Procedure Laterality Date  . DIAGNOSTIC LAPAROSCOPY WITH REMOVAL OF ECTOPIC PREGNANCY Left 06/02/2017   Procedure: DIAGNOSTIC LAPAROSCOPY WITH REMOVAL OF ECTOPIC PREGNANCY;  Surgeon: Ward, Honor Loh, MD;  Location: [facility] ORS;  Service: Gynecology;  Laterality: Left;  . ECTOPIC PREGNANCY SURGERY  02/2016  . LAPAROTOMY N/A 06/02/2017   Procedure: EXPLORATORY LAPAROTOMY;  Surgeon: Ward, Honor Loh, MD;  Location: [facility] ORS;  Service: Gynecology;  Laterality: N/A;  . LAPAROTOMY Left 09/2015  . LAPAROTOMY Left 08/2016    Family History  Problem Relation Age of Onset  . Hypertension Mother   . Arthritis Mother   . Mental retardation Sister   . Depression Brother   . Clotting disorder Maternal Grandmother   . Pulmonary embolism Maternal Grandmother   . Hypertension Maternal Grandmother     Social History   Socioeconomic History  . Marital status: Divorced    Spouse name: Not on file   . Number of children: 0  . Years of education: Not on file  . Highest education level: Some college, no degree  Occupational History  . Occupation: Ship broker    Comment: online school for business management   Social Needs  . Financial resource strain: Hard  . Food insecurity:    Worry: Never true    Inability: Never true  . Transportation needs:    Medical: No    Non-medical: No  Tobacco Use  . Smoking status: Never Smoker  . Smokeless tobacco: Never Used  Substance and Sexual Activity  . Alcohol use: Never    Frequency: Never  . Drug use: Never  . Sexual activity: Yes    Partners: Male    Birth control/protection: None  Lifestyle  . Physical activity:    Days per week: 1 day    Minutes per session: 30 min  . Stress: To some extent  Relationships  . Social connections:    Talks on phone: More than three times a week    Gets together: More than three times a week    Attends religious service: Never    Active member of club or organization: No    Attends meetings of clubs or organizations: Never    Relationship status: Divorced  . Intimate partner violence:    Fear of current or ex partner: No    Emotionally abused: No    Physically abused: No    Forced sexual  activity: No  Other Topics Concern  . Not on file  Social History Narrative   Parents divorced when she was a child, they used to live in [place] and mother moved them to [place] after divorce when she was 27 yo. Grew up in [place] and moved to [place] after her own divorced Feb 2019   Living with boyfriend now     Current Outpatient Medications:  .  Prenat w/o A-FE-Methfol-FA-DHA (PNV-DHA) 27-0.6-0.4-300 MG CAPS, Take by mouth., Disp: , Rfl:  .  Cranberry 1000 MG CAPS, Take 1 capsule by mouth daily., Disp: , Rfl:  .  cyclobenzaprine (FLEXERIL) 5 MG tablet, Take 1 tablet (5 mg total) by mouth at bedtime as needed for muscle spasms., Disp: 30 tablet, Rfl: 1 .  polycarbophil (FIBERCON) 625 MG tablet, Take 625 mg by mouth daily.,  Disp: , Rfl:   No Known Allergies   ROS  Constitutional: Negative for fever or weight change.  Respiratory: Negative for cough and shortness of breath.   Cardiovascular: Negative for chest pain or palpitations.  Gastrointestinal: Negative for abdominal pain, no bowel changes.  Musculoskeletal: Negative for gait problem or joint swelling.  Skin: Negative for rash.  Neurological: Negative for dizziness or headache.  No other specific complaints in a complete review of systems (except as listed in HPI above).  Objective  Vitals:   09/06/17 1137  BP: 130/78  Pulse: (!) 104  Resp: 18  Temp: 98.3 F (36.8 C)  TempSrc: Oral  SpO2: 94%  Weight: 145 lb 11.2 oz (66.1 kg)  Height: 5\' 2"  (1.575 m)    Body mass index is 26.65 kg/m.  Physical Exam  Constitutional: Patient appears well-developed and well-nourished. Overweight. No distress.  HEENT: head atraumatic, normocephalic, pupils equal and reactive to light,  neck supple, throat within normal limits Cardiovascular: Normal rate, regular rhythm and normal heart sounds.  No murmur heard. No BLE edema. Pulmonary/Chest: Effort normal and breath sounds normal. No respiratory distress. Abdominal: Soft.  There is no tenderness. Muscular Skeletal: thoracic scoliosis  Psychiatric: Patient has a normal mood and affect. behavior is normal. Judgment and thought content normal.  PHQ2/9: Depression screen PHQ 2/9 09/06/2017  Decreased Interest 0  Down, Depressed, Hopeless 0  PHQ - 2 Score 0  Altered sleeping 1  Tired, decreased energy 0  Change in appetite 0  Feeling bad or failure about yourself  0  Trouble concentrating 0  Moving slowly or fidgety/restless 0  Suicidal thoughts 0  PHQ-9 Score 1  Difficult doing work/chores Not difficult at all     Fall Risk: Fall Risk  09/06/2017  Falls in the past year? No     Functional Status Survey: Is the patient deaf or have difficulty hearing?: No Does the patient have difficulty  seeing, even when wearing glasses/contacts?: Yes(glasses) Does the patient have difficulty concentrating, remembering, or making decisions?: No Does the patient have difficulty walking or climbing stairs?: No Does the patient have difficulty dressing or bathing?: No Does the patient have difficulty doing errands alone such as visiting a doctor's office or shopping?: No    Assessment & Plan  1. Endometriosis  Under the care of Dr. Leonides Schanz   2. Overweight (BMI 25.0-29.9)  Discussed with the patient the risk posed by an increased BMI. Discussed importance of portion control, calorie counting and at least 150 minutes of physical activity weekly. Avoid sweet beverages and drink more water. Eat at least 6 servings of fruit and vegetables daily   3. Other  idiopathic scoliosis, thoracic region  - cyclobenzaprine (FLEXERIL) 5 MG tablet; Take 1 tablet (5 mg total) by mouth at bedtime as needed for muscle spasms.  Dispense: 30 tablet; Refill: 1  4. Need for Tdap vaccination  - Tdap vaccine greater than or equal to 7yo IM   5. Screening for deficiency anemia  - CBC with Differential/Platelet  6. Diabetes mellitus screening  - Hemoglobin A1c  7. Lipid screening  - Lipid panel  8. Hypokalemia  - Comp. Metabolic Panel (12)  9. Anemia, unspecified type  - Comp. Metabolic Panel (12)

## 2017-09-07 LAB — CBC WITH DIFFERENTIAL/PLATELET
Basophils Absolute: 19 cells/uL (ref 0–200)
Basophils Relative: 0.4 %
EOS PCT: 2.3 %
Eosinophils Absolute: 108 cells/uL (ref 15–500)
HCT: 35.6 % (ref 35.0–45.0)
Hemoglobin: 12 g/dL (ref 11.7–15.5)
Lymphs Abs: 1819 cells/uL (ref 850–3900)
MCH: 28.6 pg (ref 27.0–33.0)
MCHC: 33.7 g/dL (ref 32.0–36.0)
MCV: 84.8 fL (ref 80.0–100.0)
MONOS PCT: 7.2 %
MPV: 10.7 fL (ref 7.5–12.5)
NEUTROS PCT: 51.4 %
Neutro Abs: 2416 cells/uL (ref 1500–7800)
PLATELETS: 306 10*3/uL (ref 140–400)
RBC: 4.2 10*6/uL (ref 3.80–5.10)
RDW: 13 % (ref 11.0–15.0)
TOTAL LYMPHOCYTE: 38.7 %
WBC mixed population: 338 cells/uL (ref 200–950)
WBC: 4.7 10*3/uL (ref 3.8–10.8)

## 2017-09-07 LAB — LIPID PANEL
CHOL/HDL RATIO: 2.6 (calc) (ref <5.0)
Cholesterol: 184 mg/dL (ref <200)
HDL: 70 mg/dL (ref >50)
LDL Cholesterol (Calc): 98 mg/dL (calc)
NON-HDL CHOLESTEROL (CALC): 114 mg/dL (ref <130)
Triglycerides: 74 mg/dL (ref <150)

## 2017-09-07 LAB — HEMOGLOBIN A1C
HEMOGLOBIN A1C: 5 %{Hb} (ref <5.7)
MEAN PLASMA GLUCOSE: 97 (calc)
eAG (mmol/L): 5.4 (calc)

## 2017-09-27 ENCOUNTER — Telehealth: Payer: Self-pay | Admitting: Family Medicine

## 2017-09-29 ENCOUNTER — Encounter: Payer: Self-pay | Admitting: Family Medicine

## 2017-10-03 ENCOUNTER — Other Ambulatory Visit: Payer: Self-pay | Admitting: Family Medicine

## 2017-10-03 ENCOUNTER — Telehealth: Payer: Self-pay | Admitting: Family Medicine

## 2017-10-03 DIAGNOSIS — R102 Pelvic and perineal pain: Secondary | ICD-10-CM

## 2017-10-03 DIAGNOSIS — N809 Endometriosis, unspecified: Secondary | ICD-10-CM

## 2017-10-03 NOTE — Telephone Encounter (Signed)
Copied from [place] [id number]. Topic: Referral - Status >> Oct 03, 2017  2:46 PM Suzanne Cross, NT wrote: Reason for XIP:[id number] called to follow up on her request to see the gyn at [facility] please advise

## 2017-10-03 NOTE — Telephone Encounter (Signed)
I do not see where a referral was placed to see the gyn.  Please advise

## 2017-10-30 ENCOUNTER — Encounter: Payer: Self-pay | Admitting: Family Medicine

## 2017-11-01 NOTE — Progress Notes (Signed)
Name: Suzanne Cross   MRN: [id number]    DOB: [birth year]   Date:11/02/2017       Progress Note  Subjective  Chief Complaint  Chief Complaint  Patient presents with  . Gastroesophageal Reflux    HPI  Patient endorses a week of really bad heartburn. Notes burning pain in central chest and occasional stabbing pains in stomach. Patient has tried OTC acid reducers- 10 mg of famotidine at night; takes tums before mealswithout much relief. Pain is worse with laying down and causes burning to go up to thoat. Made her own wedge pillow, avoids spicy foods with minimal relief. States had this before and trialed PPI states PPI caused stomach pain but made acid feel better.   Patient Active Problem List   Diagnosis Date Noted  . Endometriosis 09/06/2017  . Other idiopathic scoliosis, thoracic region 09/06/2017  . Overweight (BMI 25.0-29.9) 09/06/2017  . History of ectopic pregnancy 06/19/2017    Past Medical History:  Diagnosis Date  . Acid reflux disease   . Chocolate cyst of ovary    Left  . Endometriosis     Past Surgical History:  Procedure Laterality Date  . DIAGNOSTIC LAPAROSCOPY WITH REMOVAL OF ECTOPIC PREGNANCY Left 06/02/2017   Procedure: DIAGNOSTIC LAPAROSCOPY WITH REMOVAL OF ECTOPIC PREGNANCY;  Surgeon: Ward, Honor Loh, MD;  Location: [facility] ORS;  Service: Gynecology;  Laterality: Left;  . ECTOPIC PREGNANCY SURGERY  02/2016  . LAPAROTOMY N/A 06/02/2017   Procedure: EXPLORATORY LAPAROTOMY;  Surgeon: Ward, Honor Loh, MD;  Location: [facility] ORS;  Service: Gynecology;  Laterality: N/A;  . LAPAROTOMY Left 09/2015  . LAPAROTOMY Left 08/2016    Social History   Tobacco Use  . Smoking status: Never Smoker  . Smokeless tobacco: Never Used  Substance Use Topics  . Alcohol use: Never    Frequency: Never     Current Outpatient Medications:  .  Cranberry 1000 MG CAPS, Take 1 capsule by mouth daily., Disp: , Rfl:  .  cyclobenzaprine (FLEXERIL) 5 MG tablet, Take 1 tablet (5 mg total) by  mouth at bedtime as needed for muscle spasms., Disp: 30 tablet, Rfl: 1 .  Prenat w/o A-FE-Methfol-FA-DHA (PNV-DHA) 27-0.6-0.4-300 MG CAPS, Take by mouth., Disp: , Rfl:  .  Alum Hydroxide-Mag Carbonate 508-475 MG/10ML SUSP, Take 10 mLs by mouth 4 (four) times daily as needed., Disp: 355 mL, Rfl: 1 .  famotidine (PEPCID) 20 MG tablet, Take 1 tablet (20 mg total) by mouth 2 (two) times daily., Disp: 60 tablet, Rfl: 0 .  polycarbophil (FIBERCON) 625 MG tablet, Take 625 mg by mouth daily., Disp: , Rfl:   No Known Allergies  ROS   No other specific complaints in a complete review of systems (except as listed in HPI above).  Objective  Vitals:   11/02/17 1158  BP: 104/72  Pulse: 82  Resp: 12  Temp: 98.4 F (36.9 C)  TempSrc: Oral  SpO2: 99%  Weight: 142 lb 8 oz (64.6 kg)     Body mass index is 26.06 kg/m.  Nursing Note and Vital Signs reviewed.  Physical Exam  Constitutional: She is oriented to person, place, and time. She appears well-developed and well-nourished.  Cardiovascular: Normal rate, regular rhythm and intact distal pulses.  Pulmonary/Chest: Effort normal and breath sounds normal.  Abdominal: Soft. Bowel sounds are normal. There is tenderness (epigastric).  Neurological: She is alert and oriented to person, place, and time.  Skin: Skin is warm and dry.  Psychiatric: She has a normal mood and affect.  Her behavior is normal. Judgment and thought content normal.       No results found for this or any previous visit (from the past 48 hour(s)).  Assessment & Plan  1. Gastroesophageal reflux disease with esophagitis If unimproved in 2 weeks or worsening can switch to PPI - H. pylori breath test - famotidine (PEPCID) 20 MG tablet; Take 1 tablet (20 mg total) by mouth 2 (two) times daily.  Dispense: 60 tablet; Refill: 0 - Alum Hydroxide-Mag Carbonate 508-475 MG/10ML SUSP; Take 10 mLs by mouth 4 (four) times daily as needed.  Dispense: 355 mL; Refill: 1  2. Flu  vaccine need - Flu Vaccine QUAD 36+ mos IM

## 2017-11-01 NOTE — Patient Instructions (Addendum)
-   Take antacids prior to meals - Take famotidine 20mg  twice a day - If unimproved in 2 weeks call and let us know and we will increase to PPI   Gastroesophageal reflux disease (GERD) is a backflow of acid from the stomach into the swallowing tube (esophagus). Home care These home care steps can help you manage GERD: Avoid lying down 2 hours after meals. Avoid eating late at night. Elevate the head of your bed by 6 inches. You can do this by placing wooden blocks or bed risers under the head of your bed. Avoid wearing tight-fitting clothes. Avoid foods that might irritate your stomach, such as the following: Alcohol Fat Chocolate Caffeine Spearmint or peppermint Talk to your healthcare provider if you are taking any of the following medicines. These medicines can make GERD symptoms worse: Calcium channel blockers Theophylline Anticholinergic medicines, such as oxybutynin and benzatropine NSAIDs- ibuprofen, aleve, Advil (take acetaminophen or tylenol instead) Begin an exercise program. Ask your healthcare provider how to get started. You can benefit from simple activities, such as walking or gardening. Break the smoking habit. Enroll in a stop-smoking program to improve your chances of success. Limit alcohol intake to no more than 2 drinks a day. Take your medicines exactly as directed. Don't skip doses. Avoid over-the-counter nonsteroidal anti-inflammatory medicines, such as aspirin and ibuprofen, unless recommended by your healthcare provider for certain conditions.  If possible, avoid nitrates (heart medicines, such as nitroglycerin and isosorbide dinitrate ). Follow-up care Make a follow-up appointment as directed by our staff. When to call the healthcare provider Call your healthcare provider immediately if you have any of the following: Trouble swallowing Pain when swallowing Feeling of food caught in your chest or throat Pain in the neck, chest, or back Heartburn that causes  you to vomit Vomiting blood Black or tarry stools (from digested blood) More saliva (watering of the mouth) than usual Weight loss of more than 3% to 5% of your total body weight in a month Hoarseness or sore throat that won't go away Choking, coughing, or wheezing

## 2017-11-02 ENCOUNTER — Encounter: Payer: Self-pay | Admitting: Nurse Practitioner

## 2017-11-02 ENCOUNTER — Ambulatory Visit: Payer: Medicaid Other | Admitting: Nurse Practitioner

## 2017-11-02 VITALS — BP 104/72 | HR 82 | Temp 98.4°F | Resp 12 | Wt 142.5 lb

## 2017-11-02 DIAGNOSIS — K2100 Gastro-esophageal reflux disease with esophagitis, without bleeding: Secondary | ICD-10-CM

## 2017-11-02 DIAGNOSIS — Z23 Encounter for immunization: Secondary | ICD-10-CM | POA: Diagnosis not present

## 2017-11-02 MED ORDER — ALUM HYDROXIDE-MAG CARBONATE 508-475 MG/10ML PO SUSP: 10.0000 mL | Freq: Four times a day (QID) | ORAL | 1 refills | Status: DC | PRN

## 2017-11-02 MED ORDER — FAMOTIDINE 20 MG PO TABS: 20.0000 mg | ORAL_TABLET | Freq: Two times a day (BID) | ORAL | 0 refills | Status: DC

## 2017-11-03 LAB — H. PYLORI BREATH TEST: H. PYLORI BREATH TEST: NOT DETECTED

## 2017-11-10 DIAGNOSIS — O008 Other ectopic pregnancy without intrauterine pregnancy: Secondary | ICD-10-CM | POA: Diagnosis not present

## 2017-11-13 DIAGNOSIS — O008 Other ectopic pregnancy without intrauterine pregnancy: Secondary | ICD-10-CM | POA: Diagnosis not present

## 2017-11-15 DIAGNOSIS — N23 Unspecified renal colic: Secondary | ICD-10-CM | POA: Diagnosis not present

## 2017-11-15 DIAGNOSIS — O008 Other ectopic pregnancy without intrauterine pregnancy: Secondary | ICD-10-CM | POA: Diagnosis not present

## 2017-11-21 DIAGNOSIS — O008 Other ectopic pregnancy without intrauterine pregnancy: Secondary | ICD-10-CM | POA: Diagnosis not present

## 2017-11-22 DIAGNOSIS — O0910 Supervision of pregnancy with history of ectopic or molar pregnancy, unspecified trimester: Secondary | ICD-10-CM | POA: Diagnosis not present

## 2017-11-22 DIAGNOSIS — O3680X0 Pregnancy with inconclusive fetal viability, not applicable or unspecified: Secondary | ICD-10-CM | POA: Diagnosis not present

## 2017-11-24 ENCOUNTER — Encounter: Payer: Self-pay | Admitting: Family Medicine

## 2017-12-07 DIAGNOSIS — O3680X0 Pregnancy with inconclusive fetal viability, not applicable or unspecified: Secondary | ICD-10-CM | POA: Diagnosis not present

## 2017-12-13 ENCOUNTER — Ambulatory Visit: Payer: Medicaid Other | Admitting: Family Medicine

## 2017-12-27 DIAGNOSIS — Z3481 Encounter for supervision of other normal pregnancy, first trimester: Secondary | ICD-10-CM | POA: Diagnosis not present

## 2018-01-01 ENCOUNTER — Other Ambulatory Visit: Payer: Self-pay | Admitting: Certified Nurse Midwife

## 2018-01-01 DIAGNOSIS — Z369 Encounter for antenatal screening, unspecified: Secondary | ICD-10-CM

## 2018-01-02 DIAGNOSIS — O418X10 Other specified disorders of amniotic fluid and membranes, first trimester, not applicable or unspecified: Secondary | ICD-10-CM | POA: Diagnosis not present

## 2018-01-02 DIAGNOSIS — O468X1 Other antepartum hemorrhage, first trimester: Secondary | ICD-10-CM | POA: Diagnosis not present

## 2018-01-11 ENCOUNTER — Ambulatory Visit (HOSPITAL_BASED_OUTPATIENT_CLINIC_OR_DEPARTMENT_OTHER)
Admission: RE | Admit: 2018-01-11 | Discharge: 2018-01-11 | Disposition: A | Discharge status: Home / Self Care | Payer: Medicaid Other | Source: Ambulatory Visit | Attending: Maternal & Fetal Medicine | Admitting: Maternal & Fetal Medicine

## 2018-01-11 ENCOUNTER — Ambulatory Visit
Admission: RE | Admit: 2018-01-11 | Discharge: 2018-01-11 | Disposition: A | Discharge status: Home / Self Care | Payer: Medicaid Other | Source: Ambulatory Visit | Attending: Maternal & Fetal Medicine | Admitting: Maternal & Fetal Medicine

## 2018-01-11 VITALS — BP 130/70 | HR 113 | Temp 98.1°F | Resp 18 | Ht 62.0 in | Wt 153.0 lb

## 2018-01-11 DIAGNOSIS — Z3481 Encounter for supervision of other normal pregnancy, first trimester: Secondary | ICD-10-CM | POA: Insufficient documentation

## 2018-01-11 DIAGNOSIS — Z8489 Family history of other specified conditions: Secondary | ICD-10-CM

## 2018-01-11 DIAGNOSIS — Z3A12 12 weeks gestation of pregnancy: Secondary | ICD-10-CM | POA: Insufficient documentation

## 2018-01-11 DIAGNOSIS — Z369 Encounter for antenatal screening, unspecified: Secondary | ICD-10-CM

## 2018-01-11 HISTORY — DX: Unspecified ectopic pregnancy without intrauterine pregnancy: O00.90

## 2018-01-11 HISTORY — DX: Scoliosis, unspecified: M41.9

## 2018-01-11 NOTE — Progress Notes (Addendum)
Referring physician:  Lisette Grinder, CNM Length of Consultation: 40 minutes   [name]  was referred to [place] for genetic counseling to review prenatal screening and testing options along with her family history of developmental disabilities in her sister.  This note summarizes the information we discussed.    We offered the following routine screening tests for this pregnancy:  First trimester screening, which includes nuchal translucency ultrasound screen and first trimester maternal serum marker screening.  The nuchal translucency has approximately an 80% detection rate for Down syndrome and can be positive for other chromosome abnormalities as well as congenital heart defects.  When combined with a maternal serum marker screening, the detection rate is up to 90% for Down syndrome and up to 97% for trisomy 18.     Maternal serum marker screening, a blood test that measures pregnancy proteins, can provide risk assessments for Down syndrome, trisomy 18, and open neural tube defects (spina bifida, anencephaly). Because it does not directly examine the fetus, it cannot positively diagnose or rule out these problems.  Targeted ultrasound uses high frequency sound waves to create an image of the developing fetus.  An ultrasound is often recommended as a routine means of evaluating the pregnancy.  It is also used to screen for fetal anatomy problems (for example, a heart defect) that might be suggestive of a chromosomal or other abnormality.   Should these screening tests indicate an increased concern, then the following additional testing options would be offered:  The chorionic villus sampling procedure is available for first trimester chromosome analysis.  This involves the withdrawal of a small amount of chorionic villi (tissue from the developing placenta).  Risk of pregnancy loss is estimated to be approximately 1 in 200 to 1 in 100 (0.5 to 1%).  There is  approximately a 1% (1 in 100) chance that the CVS chromosome results will be unclear.  Chorionic villi cannot be tested for neural tube defects.     Amniocentesis involves the removal of a small amount of amniotic fluid from the sac surrounding the fetus with the use of a thin needle inserted through the maternal abdomen and uterus.  Ultrasound guidance is used throughout the procedure.  Fetal cells from amniotic fluid are directly evaluated and > 99.5% of chromosome problems and > 98% of open neural tube defects can be detected. This procedure is generally performed after the 15th week of pregnancy.  The main risks to this procedure include complications leading to miscarriage in less than 1 in 200 cases (0.5%).  As another option for information if the pregnancy is suspected to be an an increased chance for certain chromosome conditions, we also reviewed the availability of cell free fetal DNA testing from maternal blood to determine whether or not the baby may have either Down syndrome, trisomy 69, or trisomy 75.  This test utilizes a maternal blood sample and DNA sequencing technology to isolate circulating cell free fetal DNA from maternal plasma.  The fetal DNA can then be analyzed for DNA sequences that are derived from the three most common chromosomes involved in aneuploidy, chromosomes 13, 18, and 21.  If the overall amount of DNA is greater than the expected level for any of these chromosomes, aneuploidy is suspected.  While we do not consider it a replacement for invasive testing and karyotype analysis, a negative result from this testing would be reassuring, though not a guarantee of a normal chromosome complement for the baby.  An  abnormal result is certainly suggestive of an abnormal chromosome complement, though we would still recommend CVS or amniocentesis to confirm any findings from this testing.  Cystic Fibrosis and Spinal Muscular Atrophy (SMA) screening were also discussed with the  patient. Both conditions are recessive, which means that both parents must be carriers in order to have a child with the disease.  Cystic fibrosis (CF) is one of the most common genetic conditions in persons of Caucasian ancestry.  This condition occurs in approximately 1 in 2,500 Caucasian persons and results in thickened secretions in the lungs, digestive, and reproductive systems.  For a baby to be at risk for having CF, both of the parents must be carriers for this condition.  Approximately 1 in 69 Caucasian persons is a carrier for CF.  Current carrier testing looks for the most common mutations in the gene for CF and can detect approximately 90% of carriers in the Caucasian population.  This means that the carrier screening can greatly reduce, but cannot eliminate, the chance for an individual to have a child with CF.  If an individual is found to be a carrier for CF, then carrier testing would be available for the partner. As part of [place] newborn screening profile, all babies born in the state of [place] will have a two-tier screening process.  Specimens are first tested to determine the concentration of immunoreactive trypsinogen (IRT).  The top 5% of specimens with the highest IRT values then undergo DNA testing using a panel of over 40 common CF mutations. SMA is a neurodegenerative disorder that leads to atrophy of skeletal muscle and overall weakness.  This condition is also more prevalent in the Caucasian population, with 1 in 40-1 in 60 persons being a carrier and 1 in 6,000-1 in 10,000 children being affected.  There are multiple forms of the disease, with some causing death in infancy to other forms with survival into adulthood.  The genetics of SMA is complex, but carrier screening can detect up to 95% of carriers in the Caucasian population.  Similar to CF, a negative result can greatly reduce, but cannot eliminate, the chance to have a child with SMA. We also offered carrier  screening for hemoglobinopathies.  The patient is of [place] ancestry and her partner is of mixed [place] American, Caucasian and [place] background.  They are not consanguinous.  We obtained a detailed family history and pregnancy history. [name] reported that this is her third pregnancy, the second with her current partner.  Both of her earlier pregnancies were ectopic.  She and her full brother both have been diagnosed with scoliosis.  This condition is thought to be due to a combination of genetic and environmental factors referred to as multifactorial inheritance.  This pregnancy is likely at increased risk due to Ms. Ingram's history.  The patient also reported that her maternal half sister has intellectual disabilities and facial features consistent with Down syndrome, though she has no records of a medical diagnosis.  Her mother was 3 years old and in a home for unwed mothers when she gave birth.  The patient believes that genetic testing was done, but that her mother will not discuss it and will only state that her problems are due to lack of oxygen at birth.  There are no other family members with a similar condition.  We discussed that if her condition is related to birth trauma, then this pregnancy is not expected to be at increased risk.  If her condition is Down syndrome, we reviewed the following.  Down syndrome is caused by an extra copy of the genetic instructions located on chromosome number 21.  These extra instructions result in the characteristic facial appearance, intellectual disabilities and an increased risk for some types of birth defects.  The majority (95%) of persons with Down syndrome have three freestanding copies of chromosome number 21, called trisomy 76.  This type of Down syndrome occurs as a sporadic condition and does not increase the chance for other family members to have Down syndrome.  Rarely, Down syndrome is caused by a rearrangement of the genetic  instructions, where the extra chromosome number 21 is attached to another chromosome.  This type of chromosome rearrangement can be passed down through families and therefore may increase the chance for Down syndrome in other family members.  We cannot determine which type of Down syndrome is present without documentation of chromosome studies in the affected family member.  If there were another genetic syndrome as the cause, we would need more information to provide an accurate risk assessment. If any additional information is obtained about this history, please do not hesitate to contact us.  Lastly, the father of the baby reported that one of his maternal half brothers has mild Asperger. He had difficulty with eye contact and communication as a child but now is lives and words independently and show few if any signs of the condition.  We discussed that autism spectrum disorders can vary greatly and can have different causes.  Without a known underlying genetic syndrome as the cause, and with only one affected family members, we would expect the recurrence risk for this pregnancy to be low.  The remainder of the family history was reported to be unremarkable for birth defects, intellectual delays, recurrent pregnancy loss or known chromosome abnormalities.  [name] reported no complications or exposures that would be expected to increase the risk for birth defects in this pregnancy.  After consideration of the options, [name] elected to proceed with an ultrasound, MaterniT21 PLUS with SCA, and carrier screening for CF, SMA and hemoglobinopathies.  An ultrasound was performed at the time of the visit.  The gestational age was consistent with  12 weeks.  Fetal anatomy could not be assessed due to early gestational age.  Please refer to the ultrasound report for details of that study.  [name] was encouraged to call with questions or concerns.  We can be contacted at [phone].  Labs  ordered: MaterniT21 PLUS with SCA (neg), hemoglobinopathy evaluation (normal), CF (neg) and SMA carrier screening (pending).  Wilburt Finlay, MS, CGC   I was immediately available and supervising. Erasmo Score, MD [facility]

## 2018-01-15 LAB — HEMOGLOBINOPATHY EVALUATION
HGB A: 97.9 % (ref 96.4–98.8)
HGB F QUANT: 0 % (ref 0.0–2.0)
HGB S QUANTITAION: 0 %
Hgb A2 Quant: 2.1 % (ref 1.8–3.2)
Hgb C: 0 %
Hgb Variant: 0 %

## 2018-01-16 LAB — MATERNIT21 PLUS CORE+SCA
CHROMOSOME 13: NEGATIVE
CHROMOSOME 18: NEGATIVE
CHROMOSOME 21: NEGATIVE
Fetal Fraction: 8
Y Chromosome: NOT DETECTED

## 2018-01-16 LAB — CYSTIC FIBROSIS GENE TEST

## 2018-01-18 ENCOUNTER — Telehealth: Payer: Self-pay | Admitting: Obstetrics and Gynecology

## 2018-01-18 NOTE — Telephone Encounter (Signed)
The patient was informed of the results of her recent MaterniT21 testing which yielded NEGATIVE results.  The patient's specimen showed DNA consistent with two copies of chromosomes 21, 18 and 13.  The sensitivity for trisomy 14, trisomy 15 and trisomy 62 using this testing are reported as 99.1%, 99.9% and 91.7% respectively.  Thus, while the results of this testing are highly accurate, they are not considered diagnostic at this time.  Should more definitive information be desired, the patient may still consider amniocentesis.   As requested to know by the patient, sex chromosome analysis was included for this sample.  Results are consistent with a female fetus, as NO Y chromosome material was detected. This is predicted with >99% accuracy.  A maternal serum AFP only should be considered if screening for neural tube defects is desired.  We also informed Ms. Dalbert Batman that the results of the recent screening test for Cystic fibrosis (CF) and hemoglobinopathy testing are now available.    CF is a genetic condition that occurs most often in Caucasian persons.  It primarily affects the lungs, digestive, and reproductive systems.  For someone to be at risk for having CF, both of their parents must be carriers for CF.  The testing can detect many persons who are carriers for CF and therefore determine if the pregnancy is at an increased risk for this condition.  The blood test results were negative when examined for the 32 most common mutations (or changes) in the gene for CF.  This means that she does not carry any of the most common changes in this gene.  Testing for these 32 mutations detects approximately 73% of carriers who are Hispanic.  Therefore, the chance that she is a carrier based on this negative result has been reduced from 1 in 46 to approximately 1 in 168.  Because this testing cannot detect all changes that may cause CF, we cannot eliminate the chance that this individual is a carrier  completely.  The hemoglobinopathy profile showed that Ms. Dalbert Batman had normal adult (AA) hemoglobin and a prior normal MCV.  She is therefore not expected to be a carrier for sickle cell or other hemoglobinopathy.  Carrier testing for Spinal Muscular Atrophy is still pending and should be available in 1 week.  If there are any questions or concerns, please feel free to contact our office at [phone].    Wilburt Finlay, MS, CGC

## 2018-01-19 LAB — SMN1 COPY NUMBER ANALYSIS (SMA CARRIER SCREENING)

## 2018-01-22 ENCOUNTER — Telehealth: Payer: Self-pay | Admitting: Obstetrics and Gynecology

## 2018-01-22 NOTE — Telephone Encounter (Signed)
The results of the SMA carrier screening are now available and have been communicated to the patient.  SMA is also a recessive genetic condition with variable age of onset and severity caused by mutations in the SMN1 gene.  This carrier testing assesses the number of copies of this gene.  Persons with one copy of the SMN1 gene are carriers, and those with no copies are affected with the condition.  Individuals with two or more copies have a reduced chance to be a carrier.  Not all mutations can be detected with this testing, though it can detect 94.8% of carriers in the Caucasian population and 90% in the Hispanic population.  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  Again, this testing cannot eliminate the chance to have a child with SMA, but dramatically reduces the chance.    We encouraged the patient to call with any questions or concerns as they arise.  We may be reached at [phone].  Wilburt Finlay, MS, CGC

## 2018-03-01 ENCOUNTER — Telehealth: Payer: Self-pay | Admitting: Family Medicine

## 2018-03-01 NOTE — Telephone Encounter (Signed)
Pt has been scheduled.  °

## 2018-03-01 NOTE — Telephone Encounter (Signed)
Copied from [place] [id number]. Topic: Referral - Request for Referral >> Mar 01, 2018  8:42 AM Margot Ables wrote: Has patient seen PCP for this complaint?  no *If NO, is insurance requiring patient see PCP for this issue before PCP can refer them? yes Referral for which specialty: cardiology Preferred provider/office: [facility] Reason for referral: Dr. Leonides Schanz OBGYN is wanting pt to be seen for irregular heartbeat during pregnancy causing cough throughout the day but [facility] is [place] provider. Does pt need referral/auth? Please advise.

## 2018-03-02 ENCOUNTER — Ambulatory Visit: Payer: Medicaid Other | Admitting: Family Medicine

## 2018-03-02 ENCOUNTER — Encounter: Payer: Self-pay | Admitting: Family Medicine

## 2018-03-02 VITALS — BP 114/68 | HR 108 | Temp 98.2°F | Resp 20 | Ht 62.0 in | Wt 160.8 lb

## 2018-03-02 DIAGNOSIS — K2100 Gastro-esophageal reflux disease with esophagitis, without bleeding: Secondary | ICD-10-CM

## 2018-03-02 DIAGNOSIS — Z3A19 19 weeks gestation of pregnancy: Secondary | ICD-10-CM

## 2018-03-02 DIAGNOSIS — R002 Palpitations: Secondary | ICD-10-CM | POA: Diagnosis not present

## 2018-03-02 NOTE — Progress Notes (Signed)
Name: Suzanne Cross   MRN: [id number]    DOB: 05-16-1990   Date:03/02/2018       Progress Note  Subjective  Chief Complaint  Chief Complaint  Patient presents with  . Irregular Heart Beat    GYN was concern about irregular heart beat-patient will feel her heart beat extra at times-she has to inhaler involuntary feels like she is out of breath  . Gastroesophageal Reflux    Has been taking Pepcid for acid reflux  . Referral    GYN would like patient to see Cardiologist but PCP would have to make it due to her insurance    HPI  Palpitation: she states symptoms started shortly after she found out she was pregnant, however getting more noticeable, feels like a skipped beat, at least once a day, lasts just a moment and resolves by itself. However she feels like she gasps for air when it happens. She states not associated with anxiety, happens at different times of the day.   GERD: she has a long history of GERD, she stopped medication during pregnancy, but it was re-started by OB yesterday. Taking it BID. Seeing Dr. Leonides Schanz.    Patient Active Problem List   Diagnosis Date Noted  . Family history of developmental delay   . Endometriosis 09/06/2017  . Other idiopathic scoliosis, thoracic region 09/06/2017  . Overweight (BMI 25.0-29.9) 09/06/2017  . History of ectopic pregnancy 06/19/2017  . 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 DIAGNOSTIC LAPAROSCOPY WITH REMOVAL OF ECTOPIC PREGNANCY Left 06/02/2017   Procedure: DIAGNOSTIC LAPAROSCOPY WITH REMOVAL OF ECTOPIC PREGNANCY;  Surgeon: Ward, Honor Loh, MD;  Location: [facility] ORS;  Service: Gynecology;  Laterality: Left;  . ECTOPIC PREGNANCY SURGERY  02/2016  . LAPAROTOMY N/A 06/02/2017   Procedure: EXPLORATORY LAPAROTOMY;  Surgeon: Ward, Honor Loh, MD;  Location: [facility] ORS;  Service: Gynecology;  Laterality: N/A;  . LAPAROTOMY Left 09/2015  .  LAPAROTOMY Left 08/2016    Family History  Problem Relation Age of Onset  . Hypertension Mother   . Arthritis Mother   . Varicose Veins Mother   . Mental retardation Sister   . Depression Brother   . Clotting disorder Maternal Grandmother   . Pulmonary embolism Maternal Grandmother   . Hypertension Maternal Grandmother   . Varicose Veins Maternal Grandmother   . Obesity Maternal Grandmother   . Varicose Veins Sister     Social History   Socioeconomic History  . Marital status: Divorced    Spouse name: Chris-Engaged  . Number of children: 0  . Years of education: Not on file  . Highest education level: Some college, no degree  Occupational History  . Occupation: Ship broker    Comment: online school for business management   Social Needs  . Financial resource strain: Hard  . Food insecurity:    Worry: Never true    Inability: Never true  . Transportation needs:    Medical: No    Non-medical: No  Tobacco Use  . Smoking status: Never Smoker  . Smokeless tobacco: Never Used  Substance and Sexual Activity  . Alcohol use: Not Currently    Frequency: Never  . Drug use: Never  . Sexual activity: Yes    Partners: Male    Birth control/protection: None  Lifestyle  . Physical activity:    Days per week: 1 day    Minutes per session: 30 min  .  Stress: To some extent  Relationships  . Social connections:    Talks on phone: More than three times a week    Gets together: More than three times a week    Attends religious service: Never    Active member of club or organization: No    Attends meetings of clubs or organizations: Never    Relationship status: Divorced  . Intimate partner violence:    Fear of current or ex partner: No    Emotionally abused: No    Physically abused: No    Forced sexual activity: No  Other Topics Concern  . Not on file  Social History Narrative   Parents divorced when she was a child, they used to live in [place] and mother moved them to [place] after  divorce when she was 28 yo. Grew up in [place] and moved to [place] after her own divorced Feb 2019   Living with boyfriend now     Current Outpatient Medications:  .  famotidine (PEPCID) 20 MG tablet, Take 20 mg by mouth 2 (two) times daily., Disp: , Rfl:  .  Prenat w/o A-FE-Methfol-FA-DHA (PNV-DHA) 27-0.6-0.4-300 MG CAPS, Take by mouth., Disp: , Rfl:   No Known Allergies  I personally reviewed active problem list, medication list, allergies, family history, social history with the patient/caregiver today.   ROS  Constitutional: Negative for fever, positive for  weight change - she is pregnant  Respiratory: Negative for cough and shortness of breath.   Cardiovascular: Negative for chest pain or palpitations.  Gastrointestinal: Negative for abdominal pain, no bowel changes.  Musculoskeletal: Negative for gait problem or joint swelling.  Skin: Negative for rash.  Neurological: Negative for dizziness or headache.  No other specific complaints in a complete review of systems (except as listed in HPI above).  Objective  Vitals:   03/02/18 1403  BP: 114/68  Pulse: (!) 108  Resp: 20  Temp: 98.2 F (36.8 C)  TempSrc: Oral  SpO2: 99%  Weight: 160 lb 12.8 oz (72.9 kg)  Height: 5\' 2"  (1.575 m)    Body mass index is 29.41 kg/m.  Physical Exam  Constitutional: Patient appears well-developed and well-nourished. No distress.  HEENT: head atraumatic, normocephalic, pupils equal and reactive to light, neck supple, throat within normal limits Cardiovascular: Normal rate, regular rhythm and normal heart sounds.  No murmur heard. No BLE edema. Pulmonary/Chest: Effort normal and breath sounds normal. No respiratory distress. Psychiatric: Patient has a normal mood and affect. behavior is normal. Judgment and thought content normal.     PHQ2/9: Depression screen [facility] 2/9 03/02/2018 09/06/2017  Decreased Interest 0 0  Down, Depressed, Hopeless 0 0  PHQ - 2 Score 0 0  Altered sleeping - 1   Tired, decreased energy - 0  Change in appetite - 0  Feeling bad or failure about yourself  - 0  Trouble concentrating - 0  Moving slowly or fidgety/restless - 0  Suicidal thoughts - 0  PHQ-9 Score - 1  Difficult doing work/chores - Not difficult at all     Fall Risk: Fall Risk  03/02/2018 01/11/2018 11/02/2017 11/02/2017 09/06/2017  Falls in the past year? 0 0 No No No     Functional Status Survey: Is the patient deaf or have difficulty hearing?: No Does the patient have difficulty seeing, even when wearing glasses/contacts?: No Does the patient have difficulty concentrating, remembering, or making decisions?: No Does the patient have difficulty walking or climbing stairs?: No Does the patient have difficulty  dressing or bathing?: No Does the patient have difficulty doing errands alone such as visiting a doctor's office or shopping?: No   Assessment & Plan  1. Palpitation  - CBC with Differential/Platelet - COMPLETE METABOLIC PANEL WITH GFR - Thyroid Panel With TSH - Ambulatory referral to Cardiology  2. Gastroesophageal reflux disease with esophagitis  Continue Pepcid   3. [redacted] weeks gestation of pregnancy  Keep follow up with Dr. Leonides Schanz

## 2018-03-03 LAB — COMPLETE METABOLIC PANEL WITH GFR
AG RATIO: 1.6 (calc) (ref 1.0–2.5)
ALT: 30 U/L — AB (ref 6–29)
AST: 27 U/L (ref 10–30)
Albumin: 3.7 g/dL (ref 3.6–5.1)
Alkaline phosphatase (APISO): 36 U/L (ref 33–115)
BILIRUBIN TOTAL: 0.5 mg/dL (ref 0.2–1.2)
BUN/Creatinine Ratio: 11 (calc) (ref 6–22)
BUN: 4 mg/dL — AB (ref 7–25)
CHLORIDE: 104 mmol/L (ref 98–110)
CO2: 22 mmol/L (ref 20–32)
Calcium: 8.7 mg/dL (ref 8.6–10.2)
Creat: 0.37 mg/dL — ABNORMAL LOW (ref 0.50–1.10)
GFR, Est African American: 170 mL/min/{1.73_m2} (ref >=60)
GFR, Est Non African American: 146 mL/min/{1.73_m2} (ref >=60)
GLUCOSE: 78 mg/dL (ref 65–99)
Globulin: 2.3 g/dL (calc) (ref 1.9–3.7)
POTASSIUM: 3.5 mmol/L (ref 3.5–5.3)
Sodium: 135 mmol/L (ref 135–146)
TOTAL PROTEIN: 6 g/dL — AB (ref 6.1–8.1)

## 2018-03-03 LAB — CBC WITH DIFFERENTIAL/PLATELET
ABSOLUTE MONOCYTES: 540 {cells}/uL (ref 200–950)
Basophils Absolute: 32 cells/uL (ref 0–200)
Basophils Relative: 0.3 %
EOS ABS: 119 {cells}/uL (ref 15–500)
Eosinophils Relative: 1.1 %
HCT: 33.7 % — ABNORMAL LOW (ref 35.0–45.0)
HEMOGLOBIN: 11.7 g/dL (ref 11.7–15.5)
LYMPHS ABS: 2570 {cells}/uL (ref 850–3900)
MCH: 30.6 pg (ref 27.0–33.0)
MCHC: 34.7 g/dL (ref 32.0–36.0)
MCV: 88.2 fL (ref 80.0–100.0)
MPV: 10.5 fL (ref 7.5–12.5)
Monocytes Relative: 5 %
NEUTROS ABS: 7538 {cells}/uL (ref 1500–7800)
Neutrophils Relative %: 69.8 %
Platelets: 250 10*3/uL (ref 140–400)
RBC: 3.82 10*6/uL (ref 3.80–5.10)
RDW: 13 % (ref 11.0–15.0)
Total Lymphocyte: 23.8 %
WBC: 10.8 10*3/uL (ref 3.8–10.8)

## 2018-03-03 LAB — THYROID PANEL WITH TSH
Free Thyroxine Index: 2.6 (ref 1.4–3.8)
T3 Uptake: 17 % — ABNORMAL LOW (ref 22–35)
T4, Total: 15.2 ug/dL — ABNORMAL HIGH (ref 5.1–11.9)
TSH: 1.2 m[IU]/L

## 2018-05-31 ENCOUNTER — Telehealth: Payer: Self-pay | Admitting: Cardiovascular Disease

## 2018-05-31 NOTE — Telephone Encounter (Signed)

## 2018-06-04 DIAGNOSIS — O0993 Supervision of high risk pregnancy, unspecified, third trimester: Secondary | ICD-10-CM | POA: Diagnosis not present

## 2018-06-05 ENCOUNTER — Other Ambulatory Visit: Payer: Self-pay

## 2018-06-05 ENCOUNTER — Encounter: Payer: Self-pay | Admitting: Family Medicine

## 2018-06-05 ENCOUNTER — Telehealth (INDEPENDENT_AMBULATORY_CARE_PROVIDER_SITE_OTHER): Payer: Medicaid Other | Admitting: Cardiovascular Disease

## 2018-06-05 ENCOUNTER — Encounter: Payer: Self-pay | Admitting: Cardiovascular Disease

## 2018-06-05 VITALS — BP 120/79 | HR 109 | Ht 62.0 in | Wt 180.0 lb

## 2018-06-05 DIAGNOSIS — R0600 Dyspnea, unspecified: Secondary | ICD-10-CM

## 2018-06-05 DIAGNOSIS — R002 Palpitations: Secondary | ICD-10-CM | POA: Diagnosis not present

## 2018-06-05 NOTE — Patient Instructions (Signed)
Medication Instructions:  No changes If you need a refill on your cardiac medications before your next appointment, please call your pharmacy.   Lab work: None ordered If you have labs (blood work) drawn today and your tests are completely normal, you will receive your results only by: Marland Kitchen MyChart Message (if you have MyChart) OR . A paper copy in the mail If you have any lab test that is abnormal or we need to change your treatment, we will call you to review the results.  Testing/Procedures: Your physician has requested that you have an echocardiogram within the next week or two. Someone from scheduling will call to set this up. Echocardiography is a painless test that uses sound waves to create images of your heart. It provides your doctor with information about the size and shape of your heart and how well your heart's chambers and valves are working. You may receive an ultrasound enhancing agent through an IV if needed to better visualize your heart during the echo.This procedure takes approximately one hour. There are no restrictions for this procedure. This will take place at the [facility] clinic.    Follow-Up: As needed with Dr. Fletcher Anon

## 2018-06-05 NOTE — Progress Notes (Signed)
Virtual Visit via Video Note   This visit type was conducted due to national recommendations for restrictions regarding the COVID-19 Pandemic (e.g. social distancing) in an effort to limit this patient's exposure and mitigate transmission in our community.  Due to her co-morbid illnesses, this patient is at least at moderate risk for complications without adequate follow up.  This format is felt to be most appropriate for this patient at this time.  All issues noted in this document were discussed and addressed.  A limited physical exam was performed with this format.  Please refer to the patient's chart for her consent to telehealth for [facility]   Date:  06/05/2018   ID:  Suzanne Cross, DOB 1990-02-22, MRN [id number]  Patient Location: Home Provider Location: Office  PCP:  Steele Sizer, MD  Cardiologist:  No primary care provider on file.  Electrophysiologist:  None   Evaluation Performed:  New Patient Evaluation  Chief Complaint: Tachycardia and shortness of breath  History of Present Illness:    Suzanne Cross is a 28 y.o. female who was referred by Dr. Ancil Boozer for evaluation of palpitations and tachycardia.  She is [redacted] weeks pregnant with no prior cardiac history.  She is overall healthy and does not have any chronic medical conditions.  There is no history of congenital heart disease or rheumatic fever.  Family history is remarkable for hypertension.  She is not a smoker.  She noticed palpitations and tachycardia since she became pregnant and this has gradually worsened throughout pregnancy.  It is associated with shortness of breath but no chest discomfort.  It is also associated with dizziness but no syncope or presyncope.  No orthopnea or PND.  She did have mild anemia.  Labs in January showed normal TSH but T4 was slightly abnormal.  The patient does not have symptoms concerning for COVID-19 infection (fever, chills, cough, or new shortness of breath).    Past Medical  History:  Diagnosis Date  . Acid reflux disease   . Chocolate cyst of ovary    Left  . Ectopic pregnancy 2019, 2018  . Endometriosis   . Scoliosis    Past Surgical History:  Procedure Laterality Date  . DIAGNOSTIC LAPAROSCOPY WITH REMOVAL OF ECTOPIC PREGNANCY Left 06/02/2017   Procedure: DIAGNOSTIC LAPAROSCOPY WITH REMOVAL OF ECTOPIC PREGNANCY;  Surgeon: Ward, Honor Loh, MD;  Location: [facility] ORS;  Service: Gynecology;  Laterality: Left;  . ECTOPIC PREGNANCY SURGERY  02/2016  . LAPAROTOMY N/A 06/02/2017   Procedure: EXPLORATORY LAPAROTOMY;  Surgeon: Ward, Honor Loh, MD;  Location: [facility] ORS;  Service: Gynecology;  Laterality: N/A;  . LAPAROTOMY Left 09/2015  . LAPAROTOMY Left 08/2016     Current Meds  Medication Sig  . docusate sodium (COLACE) 100 MG capsule Take 100 mg by mouth daily as needed.   . famotidine (PEPCID) 20 MG tablet Take 20 mg by mouth 2 (two) times daily.  . ferrous sulfate 325 (65 FE) MG tablet Take 325 mg by mouth daily with breakfast.   . Prenat w/o A-FE-Methfol-FA-DHA (PNV-DHA) 27-0.6-0.4-300 MG CAPS Take by mouth.     Allergies:   Patient has no known allergies.   Social History   Tobacco Use  . Smoking status: Never Smoker  . Smokeless tobacco: Never Used  Substance Use Topics  . Alcohol use: Not Currently    Frequency: Never  . Drug use: Never     Family Hx: The patient's family history includes Arthritis in her mother; Clotting disorder in her maternal  grandmother; Depression in her brother; Hypertension in her maternal grandmother and mother; Mental retardation in her sister; Obesity in her maternal grandmother; Pulmonary embolism in her maternal grandmother; Varicose Veins in her maternal grandmother, mother, and sister.  ROS:   Please see the history of present illness.     All other systems reviewed and are negative.   Prior CV studies:   The following studies were reviewed today:  No prior studies  Labs/Other Tests and Data Reviewed:     EKG:  No ECG reviewed.  Recent Labs: 03/02/2018: ALT 30; BUN 4; Creat 0.37; Hemoglobin 11.7; Platelets 250; Potassium 3.5; Sodium 135; TSH 1.20   Recent Lipid Panel Lab Results  Component Value Date/Time   CHOL 184 09/06/2017 12:19 PM   TRIG 74 09/06/2017 12:19 PM   HDL 70 09/06/2017 12:19 PM   CHOLHDL 2.6 09/06/2017 12:19 PM   LDLCALC 98 09/06/2017 12:19 PM    Wt Readings from Last 3 Encounters:  06/05/18 180 lb (81.6 kg)  03/02/18 160 lb 12.8 oz (72.9 kg)  01/11/18 153 lb (69.4 kg)     Objective:    Vital Signs:  BP 120/79   Pulse (!) 109   Ht 5\' 2"  (1.575 m)   Wt 180 lb (81.6 kg)   LMP 10/21/2017   BMI 32.92 kg/m    VITAL SIGNS:  reviewed GEN:  no acute distress EYES:  sclerae anicteric, EOMI - Extraocular Movements Intact RESPIRATORY:  normal respiratory effort, symmetric expansion CARDIOVASCULAR:  no peripheral edema SKIN:  no rash, lesions or ulcers. MUSCULOSKELETAL:  no obvious deformities. NEURO:  alert and oriented x 3, no obvious focal deficit PSYCH:  normal affect  ASSESSMENT & PLAN:    1. Palpitations: Seems to be due to sinus tachycardia given that she feels her heart rate persistently above 100.  I do not suspect other forms of arrhythmia.  I expect this to improve after delivery.  I do think we have to exclude underlying cardiomyopathy or structural heart abnormalities.  I explained to her that treatment with medications is not recommended during pregnancy unless absolutely necessary and there is another indication. 2. Shortness of breath and fatigue: I am going to obtain an echocardiogram to ensure no cardiomyopathy or underlying structural heart abnormalities.  If this comes back unremarkable, no need for further cardiac work-up.  COVID-19 Education: The signs and symptoms of COVID-19 were discussed with the patient and how to seek care for testing (follow up with PCP or arrange E-visit).  The importance of social distancing was discussed today.   Time:   Today, I have spent 25 minutes with the patient with telehealth technology discussing the above problems.     Medication Adjustments/Labs and Tests Ordered: Current medicines are reviewed at length with the patient today.  Concerns regarding medicines are outlined above.   Tests Ordered: No orders of the defined types were placed in this encounter.   Medication Changes: No orders of the defined types were placed in this encounter.   Disposition:  Follow up prn  Signed, Kathlyn Sacramento, MD  06/05/2018 1:27 PM    [facility]

## 2018-06-05 NOTE — Addendum Note (Signed)
Addended by: Ricci Barker on: 06/05/2018 03:35 PM   Modules accepted: Orders

## 2018-06-14 ENCOUNTER — Other Ambulatory Visit: Payer: Self-pay

## 2018-06-14 ENCOUNTER — Ambulatory Visit (INDEPENDENT_AMBULATORY_CARE_PROVIDER_SITE_OTHER): Payer: Medicaid Other

## 2018-06-14 DIAGNOSIS — R0600 Dyspnea, unspecified: Secondary | ICD-10-CM | POA: Diagnosis not present

## 2018-06-27 DIAGNOSIS — O0993 Supervision of high risk pregnancy, unspecified, third trimester: Secondary | ICD-10-CM | POA: Diagnosis not present

## 2018-06-27 LAB — OB RESULTS CONSOLE GBS: GBS: NEGATIVE

## 2018-06-28 DIAGNOSIS — O0993 Supervision of high risk pregnancy, unspecified, third trimester: Secondary | ICD-10-CM | POA: Diagnosis not present

## 2018-06-28 LAB — OB RESULTS CONSOLE RPR: RPR: NONREACTIVE

## 2018-07-02 ENCOUNTER — Other Ambulatory Visit: Payer: Self-pay

## 2018-07-02 ENCOUNTER — Encounter
Admission: RE | Admit: 2018-07-02 | Discharge: 2018-07-02 | Disposition: A | Discharge status: Home / Self Care | Payer: Medicaid Other | Source: Ambulatory Visit | Attending: Obstetrics & Gynecology | Admitting: Obstetrics & Gynecology

## 2018-07-02 DIAGNOSIS — Z01812 Encounter for preprocedural laboratory examination: Secondary | ICD-10-CM | POA: Insufficient documentation

## 2018-07-02 HISTORY — DX: Anemia, unspecified: D64.9

## 2018-07-02 HISTORY — DX: Other complications of anesthesia, initial encounter: T88.59XA

## 2018-07-02 NOTE — Patient Instructions (Signed)
Your procedure is scheduled on: FRIDAY 07/06/18    Remember: Instructions that are not followed completely may result in serious medical risk, up to and including death, or upon the discretion of your surgeon and anesthesiologist your surgery may need to be rescheduled.     _X__ 1. Do not eat food after midnight the night before your procedure.                 No gum chewing or hard candies. You may drink clear liquids up to 2 hours                 before you are scheduled to arrive for your surgery- DO not drink clear                 liquids within 2 hours of the start of your surgery.                 Clear Liquids include:  water, apple juice without pulp, clear carbohydrate                 drink such as Clearfast or Gatorade, Black Coffee or Tea (Do not add                 anything to coffee or tea).  __X__2.  On the morning of surgery brush your teeth with toothpaste and water, you                 may rinse your mouth with mouthwash if you wish.  Do not swallow any              toothpaste of mouthwash.     _X__ 3.  No Alcohol for 24 hours before or after surgery.   _X__ 4.  Do Not Smoke or use e-cigarettes For 24 Hours Prior to Your Surgery.                 Do not use any chewable tobacco products for at least 6 hours prior to                 surgery.  ____  5.  Bring all medications with you on the day of surgery if instructed.   __X__  6.  Notify your doctor if there is any change in your medical condition      (cold, fever, infections).     Do not wear jewelry, make-up, hairpins, clips or nail polish. Do not wear lotions, powders, or perfumes.  Do not shave 48 hours prior to surgery. Men may shave face and neck. Do not bring valuables to the hospital.    [facility] is not responsible for any belongings or valuables.  Contacts, dentures/partials or body piercings may not be worn into surgery. Bring a case for your contacts, glasses or hearing aids, a denture cup will be  supplied. Leave your suitcase in the car. After surgery it may be brought to your room. For patients admitted to the hospital, discharge time is determined by your treatment team.   Patients discharged the day of surgery will not be allowed to drive home.   Please read over the following fact sheets that you were given:   MRSA Information  __X__ Take these medicines the morning of surgery with A SIP OF WATER:    1. PEPCID  2.   3.   4.  5.  6.  ____ Fleet Enema (as directed)   __X__ Use CHG Soap/SAGE wipes  as directed  ____ Use inhalers on the day of surgery  ____ Stop metformin/Janumet/Farxiga 2 days prior to surgery    ____ Take 1/2 of usual insulin dose the night before surgery. No insulin the morning          of surgery.   ____ Stop Blood Thinners Coumadin/Plavix/Xarelto/Pleta/Pradaxa/Eliquis/Effient/Aspirin  on   Or contact your Surgeon, Cardiologist or Medical Doctor regarding  ability to stop your blood thinners  __X__ Stop Anti-inflammatories 7 days before surgery such as Advil, Ibuprofen, Motrin,  BC or Goodies Powder, Naprosyn, Naproxen, Aleve, Aspirin    __X__ Stop all herbal supplements, fish oil or vitamin E until after surgery.    ____ Bring C-Pap to the hospital.

## 2018-07-04 ENCOUNTER — Other Ambulatory Visit: Payer: Self-pay

## 2018-07-04 ENCOUNTER — Ambulatory Visit
Admission: RE | Admit: 2018-07-04 | Discharge: 2018-07-04 | Disposition: A | Discharge status: Home / Self Care | Payer: Medicaid Other | Source: Ambulatory Visit | Attending: Obstetrics and Gynecology | Admitting: Obstetrics and Gynecology

## 2018-07-04 DIAGNOSIS — Z1159 Encounter for screening for other viral diseases: Secondary | ICD-10-CM | POA: Insufficient documentation

## 2018-07-05 LAB — NOVEL CORONAVIRUS, NAA (HOSP ORDER, SEND-OUT TO REF LAB; TAT 18-24 HRS): SARS-CoV-2, NAA: NOT DETECTED

## 2018-07-06 ENCOUNTER — Encounter
Admission: AD | Disposition: A | Discharge status: Home / Self Care | Payer: Self-pay | Source: Home / Self Care | Attending: Obstetrics & Gynecology

## 2018-07-06 ENCOUNTER — Inpatient Hospital Stay: Payer: Medicaid Other | Admitting: Anesthesiology

## 2018-07-06 ENCOUNTER — Other Ambulatory Visit: Payer: Self-pay

## 2018-07-06 ENCOUNTER — Inpatient Hospital Stay
Admission: AD | Admit: 2018-07-06 | Discharge: 2018-07-08 | DRG: 787 | Disposition: A | Discharge status: Home / Self Care | Payer: Medicaid Other | Attending: Obstetrics & Gynecology | Admitting: Obstetrics & Gynecology

## 2018-07-06 DIAGNOSIS — Z3A37 37 weeks gestation of pregnancy: Secondary | ICD-10-CM

## 2018-07-06 DIAGNOSIS — K219 Gastro-esophageal reflux disease without esophagitis: Secondary | ICD-10-CM | POA: Diagnosis present

## 2018-07-06 DIAGNOSIS — O9081 Anemia of the puerperium: Secondary | ICD-10-CM | POA: Diagnosis not present

## 2018-07-06 DIAGNOSIS — O9962 Diseases of the digestive system complicating childbirth: Secondary | ICD-10-CM | POA: Diagnosis present

## 2018-07-06 DIAGNOSIS — D62 Acute posthemorrhagic anemia: Secondary | ICD-10-CM | POA: Diagnosis not present

## 2018-07-06 DIAGNOSIS — O3429 Maternal care due to uterine scar from other previous surgery: Secondary | ICD-10-CM | POA: Diagnosis not present

## 2018-07-06 DIAGNOSIS — O26893 Other specified pregnancy related conditions, third trimester: Secondary | ICD-10-CM | POA: Diagnosis present

## 2018-07-06 DIAGNOSIS — O34219 Maternal care for unspecified type scar from previous cesarean delivery: Secondary | ICD-10-CM | POA: Diagnosis not present

## 2018-07-06 LAB — CBC
HCT: 35.3 % — ABNORMAL LOW (ref 36.0–46.0)
Hemoglobin: 12.1 g/dL (ref 12.0–15.0)
MCH: 30.7 pg (ref 26.0–34.0)
MCHC: 34.3 g/dL (ref 30.0–36.0)
MCV: 89.6 fL (ref 80.0–100.0)
Platelets: 221 10*3/uL (ref 150–400)
RBC: 3.94 MIL/uL (ref 3.87–5.11)
RDW: 12.7 % (ref 11.5–15.5)
WBC: 11.6 10*3/uL — ABNORMAL HIGH (ref 4.0–10.5)
nRBC: 0 % (ref 0.0–0.2)

## 2018-07-06 LAB — OB RESULTS CONSOLE GC/CHLAMYDIA
Chlamydia: NEGATIVE
Gonorrhea: NEGATIVE

## 2018-07-06 LAB — OB RESULTS CONSOLE HEPATITIS B SURFACE ANTIGEN: Hepatitis B Surface Ag: NEGATIVE

## 2018-07-06 LAB — TYPE AND SCREEN
ABO/RH(D): A POS
Antibody Screen: NEGATIVE

## 2018-07-06 LAB — OB RESULTS CONSOLE RUBELLA ANTIBODY, IGM: Rubella: IMMUNE

## 2018-07-06 LAB — OB RESULTS CONSOLE VARICELLA ZOSTER ANTIBODY, IGG: Varicella: IMMUNE

## 2018-07-06 LAB — OB RESULTS CONSOLE HIV ANTIBODY (ROUTINE TESTING): HIV: NONREACTIVE

## 2018-07-06 SURGERY — Surgical Case
Anesthesia: Spinal

## 2018-07-06 MED ORDER — MEPERIDINE HCL 25 MG/ML IJ SOLN: 6.2500 mg | INTRAMUSCULAR | Status: DC | PRN

## 2018-07-06 MED ORDER — DIBUCAINE (PERIANAL) 1 % EX OINT: 1.0000 "application " | TOPICAL_OINTMENT | CUTANEOUS | Status: DC | PRN

## 2018-07-06 MED ORDER — PROPOFOL 10 MG/ML IV BOLUS
INTRAVENOUS | Status: AC
  Filled 2018-07-06: qty 20

## 2018-07-06 MED ORDER — LACTATED RINGERS IV SOLN: INTRAVENOUS | Status: DC

## 2018-07-06 MED ORDER — NALBUPHINE HCL 10 MG/ML IJ SOLN: 5.0000 mg | Freq: Once | INTRAMUSCULAR | Status: DC | PRN

## 2018-07-06 MED ORDER — BUPIVACAINE HCL (PF) 0.5 % IJ SOLN
30.0000 mL | Freq: Once | INTRAMUSCULAR | Status: DC
  Filled 2018-07-06: qty 30

## 2018-07-06 MED ORDER — SIMETHICONE 80 MG PO CHEW: 80.0000 mg | CHEWABLE_TABLET | ORAL | Status: DC

## 2018-07-06 MED ORDER — SUCCINYLCHOLINE CHLORIDE 20 MG/ML IJ SOLN
INTRAMUSCULAR | Status: AC
  Filled 2018-07-06: qty 1

## 2018-07-06 MED ORDER — OXYCODONE HCL 5 MG PO TABS
5.0000 mg | ORAL_TABLET | ORAL | Status: DC | PRN
  Administered 2018-07-06: 5 mg via ORAL
  Filled 2018-07-06: qty 1

## 2018-07-06 MED ORDER — KETOROLAC TROMETHAMINE 30 MG/ML IJ SOLN: 30.0000 mg | Freq: Four times a day (QID) | INTRAMUSCULAR | Status: DC

## 2018-07-06 MED ORDER — NALOXONE HCL 0.4 MG/ML IJ SOLN: 0.4000 mg | INTRAMUSCULAR | Status: DC | PRN

## 2018-07-06 MED ORDER — ACETAMINOPHEN 325 MG PO TABS
650.0000 mg | ORAL_TABLET | Freq: Four times a day (QID) | ORAL | Status: DC
  Administered 2018-07-06: 650 mg via ORAL
  Filled 2018-07-06: qty 2

## 2018-07-06 MED ORDER — PHENYLEPHRINE HCL (PRESSORS) 10 MG/ML IV SOLN
INTRAVENOUS | Status: DC | PRN
  Administered 2018-07-06 (×6): 100 ug via INTRAVENOUS
  Administered 2018-07-06: 50 ug via INTRAVENOUS

## 2018-07-06 MED ORDER — GABAPENTIN 300 MG PO CAPS
300.0000 mg | ORAL_CAPSULE | Freq: Every day | ORAL | Status: DC
  Administered 2018-07-06 – 2018-07-07 (×2): 300 mg via ORAL
  Filled 2018-07-06 (×2): qty 1

## 2018-07-06 MED ORDER — IBUPROFEN 600 MG PO TABS
600.0000 mg | ORAL_TABLET | Freq: Four times a day (QID) | ORAL | Status: DC
  Administered 2018-07-06 – 2018-07-08 (×5): 600 mg via ORAL
  Filled 2018-07-06 (×5): qty 1

## 2018-07-06 MED ORDER — CEFAZOLIN SODIUM-DEXTROSE 2-4 GM/100ML-% IV SOLN
2.0000 g | INTRAVENOUS | Status: AC
  Administered 2018-07-06: 2 g via INTRAVENOUS
  Filled 2018-07-06 (×2): qty 100

## 2018-07-06 MED ORDER — SODIUM CHLORIDE 0.9% FLUSH
3.0000 mL | Freq: Two times a day (BID) | INTRAVENOUS | Status: DC
  Administered 2018-07-07: 3 mL via INTRAVENOUS

## 2018-07-06 MED ORDER — MORPHINE SULFATE (PF) 0.5 MG/ML IJ SOLN
INTRAMUSCULAR | Status: DC | PRN
  Administered 2018-07-06: .1 mg via INTRATHECAL

## 2018-07-06 MED ORDER — SODIUM CHLORIDE 0.9 % IV SOLN
INTRAVENOUS | Status: DC | PRN
  Administered 2018-07-06: 09:00:00 via INTRAVENOUS

## 2018-07-06 MED ORDER — LACTATED RINGERS IV SOLN
INTRAVENOUS | Status: DC
  Administered 2018-07-06: 06:00:00 via INTRAVENOUS

## 2018-07-06 MED ORDER — TETANUS-DIPHTH-ACELL PERTUSSIS 5-2.5-18.5 LF-MCG/0.5 IM SUSP: 0.5000 mL | Freq: Once | INTRAMUSCULAR | Status: DC

## 2018-07-06 MED ORDER — ACETAMINOPHEN 500 MG PO TABS
1000.0000 mg | ORAL_TABLET | Freq: Four times a day (QID) | ORAL | Status: DC
  Administered 2018-07-06 – 2018-07-08 (×5): 1000 mg via ORAL
  Filled 2018-07-06 (×5): qty 2

## 2018-07-06 MED ORDER — KETOROLAC TROMETHAMINE 30 MG/ML IJ SOLN
30.0000 mg | Freq: Four times a day (QID) | INTRAMUSCULAR | Status: DC
  Administered 2018-07-06 (×2): 30 mg via INTRAVENOUS
  Filled 2018-07-06: qty 1

## 2018-07-06 MED ORDER — SODIUM CHLORIDE 0.9% FLUSH: 3.0000 mL | INTRAVENOUS | Status: DC | PRN

## 2018-07-06 MED ORDER — FENTANYL CITRATE (PF) 100 MCG/2ML IJ SOLN
INTRAMUSCULAR | Status: DC | PRN
  Administered 2018-07-06: 15 ug via INTRATHECAL

## 2018-07-06 MED ORDER — GABAPENTIN 300 MG PO CAPS
600.0000 mg | ORAL_CAPSULE | Freq: Once | ORAL | Status: AC
  Administered 2018-07-06: 600 mg via ORAL
  Filled 2018-07-06: qty 2

## 2018-07-06 MED ORDER — PHENYLEPHRINE HCL (PRESSORS) 10 MG/ML IV SOLN
INTRAVENOUS | Status: AC
  Filled 2018-07-06: qty 1

## 2018-07-06 MED ORDER — DIPHENHYDRAMINE HCL 25 MG PO CAPS: 25.0000 mg | ORAL_CAPSULE | ORAL | Status: DC | PRN

## 2018-07-06 MED ORDER — OXYTOCIN 40 UNITS IN NORMAL SALINE INFUSION - SIMPLE MED
INTRAVENOUS | Status: DC | PRN
  Administered 2018-07-06: 500 mL via INTRAVENOUS

## 2018-07-06 MED ORDER — DIPHENHYDRAMINE HCL 25 MG PO CAPS: 25.0000 mg | ORAL_CAPSULE | Freq: Four times a day (QID) | ORAL | Status: DC | PRN

## 2018-07-06 MED ORDER — EPHEDRINE SULFATE 50 MG/ML IJ SOLN
INTRAMUSCULAR | Status: AC
  Filled 2018-07-06: qty 1

## 2018-07-06 MED ORDER — SENNOSIDES-DOCUSATE SODIUM 8.6-50 MG PO TABS
1.0000 | ORAL_TABLET | ORAL | Status: DC
  Administered 2018-07-06 – 2018-07-08 (×2): 1 via ORAL
  Filled 2018-07-06 (×2): qty 1

## 2018-07-06 MED ORDER — BUPIVACAINE LIPOSOME 1.3 % IJ SUSP
20.0000 mL | Freq: Once | INTRAMUSCULAR | Status: DC
  Filled 2018-07-06: qty 20

## 2018-07-06 MED ORDER — ACETAMINOPHEN 325 MG PO TABS: 650.0000 mg | ORAL_TABLET | Freq: Four times a day (QID) | ORAL | Status: DC

## 2018-07-06 MED ORDER — DEXAMETHASONE SODIUM PHOSPHATE 10 MG/ML IJ SOLN
INTRAMUSCULAR | Status: AC
  Filled 2018-07-06: qty 1

## 2018-07-06 MED ORDER — BISACODYL 10 MG RE SUPP: 10.0000 mg | Freq: Every day | RECTAL | Status: DC | PRN

## 2018-07-06 MED ORDER — OXYCODONE HCL 5 MG PO TABS
5.0000 mg | ORAL_TABLET | ORAL | Status: DC | PRN
  Administered 2018-07-07: 10 mg via ORAL
  Administered 2018-07-07: 5 mg via ORAL
  Administered 2018-07-07: 10 mg via ORAL
  Administered 2018-07-08: 5 mg via ORAL
  Filled 2018-07-06: qty 2
  Filled 2018-07-06: qty 1
  Filled 2018-07-06: qty 2
  Filled 2018-07-06: qty 1

## 2018-07-06 MED ORDER — SODIUM CHLORIDE 0.9 % IV SOLN
INTRAVENOUS | Status: DC | PRN
  Administered 2018-07-06: 09:00:00 50 ug/min via INTRAVENOUS

## 2018-07-06 MED ORDER — ONDANSETRON HCL 4 MG/2ML IJ SOLN: 4.0000 mg | Freq: Three times a day (TID) | INTRAMUSCULAR | Status: DC | PRN

## 2018-07-06 MED ORDER — MENTHOL 3 MG MT LOZG
1.0000 | LOZENGE | OROMUCOSAL | Status: DC | PRN
  Filled 2018-07-06: qty 9

## 2018-07-06 MED ORDER — SODIUM CHLORIDE (PF) 0.9 % IJ SOLN
INTRAMUSCULAR | Status: DC | PRN
  Administered 2018-07-06: 50 mL via INTRAVENOUS

## 2018-07-06 MED ORDER — MEASLES, MUMPS & RUBELLA VAC IJ SOLR: 0.5000 mL | Freq: Once | INTRAMUSCULAR | Status: DC

## 2018-07-06 MED ORDER — OXYCODONE HCL 5 MG PO TABS: 10.0000 mg | ORAL_TABLET | ORAL | Status: DC | PRN

## 2018-07-06 MED ORDER — DEXAMETHASONE SODIUM PHOSPHATE 10 MG/ML IJ SOLN
INTRAMUSCULAR | Status: DC | PRN
  Administered 2018-07-06: 10 mg via INTRAVENOUS

## 2018-07-06 MED ORDER — FLEET ENEMA 7-19 GM/118ML RE ENEM: 1.0000 | ENEMA | Freq: Every day | RECTAL | Status: DC | PRN

## 2018-07-06 MED ORDER — SOD CITRATE-CITRIC ACID 500-334 MG/5ML PO SOLN
30.0000 mL | ORAL | Status: AC
  Administered 2018-07-06: 30 mL via ORAL
  Filled 2018-07-06: qty 30

## 2018-07-06 MED ORDER — FENTANYL CITRATE (PF) 100 MCG/2ML IJ SOLN
INTRAMUSCULAR | Status: AC
  Filled 2018-07-06: qty 2

## 2018-07-06 MED ORDER — LIDOCAINE HCL (PF) 2 % IJ SOLN
INTRAMUSCULAR | Status: AC
  Filled 2018-07-06: qty 10

## 2018-07-06 MED ORDER — OXYTOCIN 40 UNITS IN NORMAL SALINE INFUSION - SIMPLE MED
2.5000 [IU]/h | INTRAVENOUS | Status: AC
  Administered 2018-07-06 (×2): 2.5 [IU]/h via INTRAVENOUS
  Filled 2018-07-06: qty 1000

## 2018-07-06 MED ORDER — NALBUPHINE HCL 10 MG/ML IJ SOLN: 5.0000 mg | INTRAMUSCULAR | Status: DC | PRN

## 2018-07-06 MED ORDER — WITCH HAZEL-GLYCERIN EX PADS: 1.0000 "application " | MEDICATED_PAD | CUTANEOUS | Status: DC | PRN

## 2018-07-06 MED ORDER — COCONUT OIL OIL
1.0000 "application " | TOPICAL_OIL | Status: DC | PRN
  Filled 2018-07-06: qty 120

## 2018-07-06 MED ORDER — BUPIVACAINE IN DEXTROSE 0.75-8.25 % IT SOLN
INTRATHECAL | Status: DC | PRN
  Administered 2018-07-06: 1.6 mL via INTRATHECAL

## 2018-07-06 MED ORDER — SENNOSIDES-DOCUSATE SODIUM 8.6-50 MG PO TABS: 2.0000 | ORAL_TABLET | ORAL | Status: DC

## 2018-07-06 MED ORDER — KETOROLAC TROMETHAMINE 30 MG/ML IJ SOLN
INTRAMUSCULAR | Status: AC
  Filled 2018-07-06: qty 1

## 2018-07-06 MED ORDER — SIMETHICONE 80 MG PO CHEW: 160.0000 mg | CHEWABLE_TABLET | Freq: Four times a day (QID) | ORAL | Status: DC | PRN

## 2018-07-06 MED ORDER — DIPHENHYDRAMINE HCL 50 MG/ML IJ SOLN: 12.5000 mg | INTRAMUSCULAR | Status: DC | PRN

## 2018-07-06 MED ORDER — SIMETHICONE 80 MG PO CHEW: 80.0000 mg | CHEWABLE_TABLET | ORAL | Status: DC | PRN

## 2018-07-06 MED ORDER — ONDANSETRON HCL 4 MG/2ML IJ SOLN
INTRAMUSCULAR | Status: DC | PRN
  Administered 2018-07-06: 4 mg via INTRAVENOUS

## 2018-07-06 MED ORDER — BUPIVACAINE LIPOSOME 1.3 % IJ SUSP
INTRAMUSCULAR | Status: DC | PRN
  Administered 2018-07-06: 50 mL

## 2018-07-06 MED ORDER — ACETAMINOPHEN 650 MG RE SUPP
650.0000 mg | Freq: Once | RECTAL | Status: AC
  Administered 2018-07-06: 650 mg via RECTAL
  Filled 2018-07-06: qty 1

## 2018-07-06 MED ORDER — MORPHINE SULFATE (PF) 0.5 MG/ML IJ SOLN
INTRAMUSCULAR | Status: AC
  Filled 2018-07-06: qty 10

## 2018-07-06 MED ORDER — LACTATED RINGERS IV SOLN
Freq: Once | INTRAVENOUS | Status: AC
  Administered 2018-07-06: 08:00:00 via INTRAVENOUS

## 2018-07-06 MED ORDER — FERROUS SULFATE 325 (65 FE) MG PO TABS
325.0000 mg | ORAL_TABLET | Freq: Two times a day (BID) | ORAL | Status: DC
  Administered 2018-07-06 – 2018-07-08 (×4): 325 mg via ORAL
  Filled 2018-07-06 (×4): qty 1

## 2018-07-06 MED ORDER — SODIUM CHLORIDE 0.9 % IV SOLN: 250.0000 mL | INTRAVENOUS | Status: DC

## 2018-07-06 MED ORDER — PRENATAL MULTIVITAMIN CH
1.0000 | ORAL_TABLET | Freq: Every day | ORAL | Status: DC
  Administered 2018-07-07: 1 via ORAL
  Filled 2018-07-06: qty 1

## 2018-07-06 MED ORDER — SIMETHICONE 80 MG PO CHEW
80.0000 mg | CHEWABLE_TABLET | Freq: Three times a day (TID) | ORAL | Status: DC
  Administered 2018-07-06: 80 mg via ORAL
  Filled 2018-07-06: qty 1

## 2018-07-06 MED ORDER — IBUPROFEN 800 MG PO TABS: 800.0000 mg | ORAL_TABLET | Freq: Four times a day (QID) | ORAL | Status: DC

## 2018-07-06 SURGICAL SUPPLY — 36 items
CANISTER SUCT 3000ML PPV (MISCELLANEOUS) ×3 IMPLANT
CLOSURE WOUND 1/2 X4 (GAUZE/BANDAGES/DRESSINGS) ×1
COVER WAND RF STERILE (DRAPES) ×1 IMPLANT
DERMABOND ADVANCED (GAUZE/BANDAGES/DRESSINGS) ×2
DERMABOND ADVANCED .7 DNX12 (GAUZE/BANDAGES/DRESSINGS) ×1 IMPLANT
DRAPE C SECTION CLR SCREEN (DRAPES) ×2 IMPLANT
DRSG TELFA 3X8 NADH (GAUZE/BANDAGES/DRESSINGS) ×6 IMPLANT
ELECT CAUTERY BLADE 6.4 (BLADE) ×3 IMPLANT
ELECT REM PT RETURN 9FT ADLT (ELECTROSURGICAL) ×3
ELECTRODE REM PT RTRN 9FT ADLT (ELECTROSURGICAL) ×1 IMPLANT
GAUZE SPONGE 4X4 12PLY STRL (GAUZE/BANDAGES/DRESSINGS) ×3 IMPLANT
GLOVE PI ORTHOPRO 6.5 (GLOVE) ×2
GLOVE PI ORTHOPRO STRL 6.5 (GLOVE) ×1 IMPLANT
GLOVE SURG SYN 6.5 ES PF (GLOVE) ×9 IMPLANT
GLOVE SURG SYN 6.5 PF PI (GLOVE) ×1 IMPLANT
GOWN STRL REUS W/ TWL LRG LVL3 (GOWN DISPOSABLE) ×3 IMPLANT
GOWN STRL REUS W/TWL LRG LVL3 (GOWN DISPOSABLE) ×6
NS IRRIG 1000ML POUR BTL (IV SOLUTION) ×3 IMPLANT
PACK C SECTION AR (MISCELLANEOUS) ×3 IMPLANT
PAD DRESSING TELFA 3X8 NADH (GAUZE/BANDAGES/DRESSINGS) ×1 IMPLANT
PAD OB MATERNITY 4.3X12.25 (PERSONAL CARE ITEMS) ×3 IMPLANT
PAD PREP 24X41 OB/GYN DISP (PERSONAL CARE ITEMS) ×3 IMPLANT
PENCIL SMOKE ULTRAEVAC 22 CON (MISCELLANEOUS) ×3 IMPLANT
STRAP SAFETY 5IN WIDE (MISCELLANEOUS) ×3 IMPLANT
STRIP CLOSURE SKIN 1/2X4 (GAUZE/BANDAGES/DRESSINGS) ×2 IMPLANT
SUCT VACUUM KIWI BELL (SUCTIONS) ×2 IMPLANT
SUT MNCRL 4-0 (SUTURE) ×2
SUT MNCRL 4-0 27XMFL (SUTURE) ×1
SUT PDS AB 1 TP1 96 (SUTURE) ×3 IMPLANT
SUT VIC AB 0 CT1 36 (SUTURE) ×6 IMPLANT
SUT VIC AB 2-0 CT1 27 (SUTURE) ×2
SUT VIC AB 2-0 CT1 TAPERPNT 27 (SUTURE) ×1 IMPLANT
SUT VIC AB 3-0 SH 27 (SUTURE) ×2
SUT VIC AB 3-0 SH 27X BRD (SUTURE) ×1 IMPLANT
SUTURE MNCRL 4-0 27XMF (SUTURE) ×1 IMPLANT
SWABSTK COMLB BENZOIN TINCTURE (MISCELLANEOUS) ×1 IMPLANT

## 2018-07-06 NOTE — Transfer of Care (Signed)
Immediate Anesthesia Transfer of Care Note  Patient: Suzanne Cross  Procedure(s) Performed: CESAREAN SECTION (N/A )  Patient Location: PACU  Anesthesia Type:Spinal  Level of Consciousness: awake, alert  and oriented  Airway & Oxygen Therapy: Patient Spontanous Breathing and Patient connected to nasal cannula oxygen  Post-op Assessment: Report given to RN and Post -op Vital signs reviewed and stable  Post vital signs: Reviewed and stable  Last Vitals:  Vitals Value Taken Time  BP 111/75 07/06/2018  9:56 AM  Temp 37.3 C 07/06/2018  9:56 AM  Pulse 100 07/06/2018  9:56 AM  Resp 17 07/06/2018  9:56 AM  SpO2      Last Pain:  Vitals:   07/06/18 0956  TempSrc: Oral  PainSc: 0-No pain         Complications: No apparent anesthesia complications

## 2018-07-06 NOTE — Op Note (Signed)
Cesarean Section Procedure Note  07/06/2018   Patient:  Suzanne Cross  28 y.o. female at [redacted]w[redacted]d.  Patient's last menstrual period was 10/21/2017. Preoperative diagnosis:  history of uterine surgery, term IUP Postoperative diagnosis:  history of uterine surgery, term IUP with live born female  PROCEDURE:  Procedure(s): VACUUM-ASSISTED CESAREAN DELIVERY  Surgeon:  Surgeon(s) and Role:    * Ward, Honor Loh, MD - Primary [place], RNFA - Assist Anesthesia:  spinal I/O: Total I/O In: 2100 [I.V.:2000; IV Piggyback:100] Out: 33 [Urine:350; Blood:365] Specimens:  none Complications: None Apparent Disposition:  VS stable to PACU  Findings: normal uterus, tubes and ovaries bilaterally Live born female "[name]" Birth Weight: 5 lb 5.4 oz (2420 g) APGAR: 7, 8  Newborn Delivery   Birth date/time:  07/06/2018 08:56:00 Delivery type:  C-Section, Vacuum Assisted Trial of labor:  No C-section categorization:  Primary      Indication for procedure: 28 y.o. female at [redacted]w[redacted]d with history of interstitial ectopic pregnancy, and surgery nearing and possibly entering the endometrial cavity, counseled to not labor in subsequent pregnancy.  Procedure Details   The risks, benefits, complications, treatment options, and expected outcomes were discussed with the patient. Informed consent was obtained. The patient was taken to Operating Room, identified as Suzanne Cross and the procedure verified as a cesarean delivery.   After administration of anesthesia, the patient was prepped and draped in the usual sterile manner, including a vaginal prep. A surgical time out was performed, with the pediatric team present. After confirming adequate anesthesia, a Pfannenstiel incision was made and carried down through the subcutaneous tissue to the fascia. Fascial incision was made and extended transversely. The fascia was separated from the underlying rectus tissue superiorly and inferiorly. The rectus muscles  were divided in the midline. The peritoneum was identified and entered. Peritoneal incision was extended longitudinally.  A low transverse uterine incision was made. The feet were next to each ear, and the calvarium was not easily guided into the hysterotomy.  A bell Kiwi vacuum was applied to the vertex and the head was guided into the hysterotomy site, and fundal pressure was given to deliver through the hysterotomy.  The vacuum was removed gently and atraumatically.  Delayed cord clamping was performed for 60 seconds. The umbilical cord was doubly clamped and cut, and the baby was handed off to the awaitng pediatrician.  The placenta was removed intact and appeared normal. The uterus was delivered from the abdominal cavity and cleared of clots, membranes, and debris. The uterus, bilateral ovaries and right fallopian tube appeared normal. The uterine incision was closed with running locking sutures of 0 Vicryl, and then a second, imbricating stitch was placed. Hemostasis was observed. The abdominal cavity was evacuated of extraneous fluid. The uterus was returned to the abdominal cavity and again the incision was inspected for hemostasis, which was confirmed.  The paracolic gutters were cleaned. The fascia was then reapproximated with running suture of vicryl. 90cc of Long- and short-acting bupivicaine was injected circumferentially into the fascia.  After a change of gloves, the subcutaneous tissue was irrigated and reapproximated with 3-0 vicryl. The skin was closed with 4-0 Monocryl and 10cc of long- and short-acting bupivacaine injected into the skin and subcutaneous tissues.  The incision was covered with surgical glue. An abdominal binder was placed.    Instrument, sponge, and needle counts were correct prior the abdominal closure and at the conclusion of the case.   I was present and performed this procedure in its  entirety.  VTE: SCDs Perioperative antibiotics: Ancef 2g  ----- Larey Days,  MD Attending Obstetrician and Gynecologist [facility], Department of [facility]

## 2018-07-06 NOTE — Anesthesia Preprocedure Evaluation (Signed)
Anesthesia Evaluation  Patient identified by MRN, date of birth, ID band Patient awake    Reviewed: Allergy & Precautions, NPO status , Patient's Chart, lab work & pertinent test results  History of Anesthesia Complications (+) history of anesthetic complications (low BP during GA)  Airway Mallampati: II  TM Distance: >3 FB Neck ROM: Full    Dental no notable dental hx.    Pulmonary neg pulmonary ROS, neg sleep apnea, neg COPD,    breath sounds clear to auscultation- rhonchi (-) wheezing      Cardiovascular Exercise Tolerance: Good (-) hypertension(-) CAD, (-) Past MI, (-) Cardiac Stents and (-) CABG  Rhythm:Regular Rate:Normal - Systolic murmurs and - Diastolic murmurs    Neuro/Psych neg Seizures PSYCHIATRIC DISORDERS Depression negative neurological ROS     GI/Hepatic Neg liver ROS, GERD  ,  Endo/Other  negative endocrine ROSneg diabetes  Renal/GU negative Renal ROS     Musculoskeletal negative musculoskeletal ROS (+)   Abdominal (+) + obese, Gravid abdomen   Peds  Hematology  (+) anemia ,   Anesthesia Other Findings Past Medical History: No date: Acid reflux disease No date: Anemia No date: Chocolate cyst of ovary     Comment:  Left No date: Complication of anesthesia     Comment:  LOW bp 2019, 2018: Ectopic pregnancy No date: Endometriosis No date: Scoliosis   Reproductive/Obstetrics (+) Pregnancy                             Lab Results  Component Value Date   WBC 11.6 (H) 07/06/2018   HGB 12.1 07/06/2018   HCT 35.3 (L) 07/06/2018   MCV 89.6 07/06/2018   PLT 221 07/06/2018    Anesthesia Physical Anesthesia Plan  ASA: II  Anesthesia Plan: Spinal   Post-op Pain Management:    Induction:   PONV Risk Score and Plan: 2 and Ondansetron  Airway Management Planned: Natural Airway  Additional Equipment:   Intra-op Plan:   Post-operative Plan:   Informed  Consent: I have reviewed the patients History and Physical, chart, labs and discussed the procedure including the risks, benefits and alternatives for the proposed anesthesia with the patient or authorized representative who has indicated his/her understanding and acceptance.     Dental advisory given  Plan Discussed with: CRNA and Anesthesiologist  Anesthesia Plan Comments:         Anesthesia Quick Evaluation

## 2018-07-06 NOTE — Discharge Summary (Signed)
Obstetrical Discharge Summary  Patient Name: Suzanne Cross DOB: November 16, 1990 MRN: [id number]  Date of Admission: 07/06/2018 Date of Delivery: 07/06/18 Delivered by: Larey Days, MD Date of Discharge: 07/08/2018  Primary OB: [facility]   TFT:[id number]'K last menstrual period was 10/21/2017. EDC Estimated Date of Delivery: 07/24/18 Gestational Age at Delivery: [redacted]w[redacted]d   Antepartum complications:  1.  Previous uterine surgery 2.  First degree relative with cognitive disabilities 3. History of anxiety and depression, no meds - currently stable. 4. Resolved fetal pylectasis  Admitting Diagnosis: scheduled cesarean, term pregnancy, previous uterine surgery Secondary Diagnosis: Patient Active Problem List   Diagnosis Date Noted  . Labor and delivery indication for care or intervention 07/06/2018  . Family history of developmental delay   . Endometriosis 09/06/2017  . Other idiopathic scoliosis, thoracic region 09/06/2017  . Overweight (BMI 25.0-29.9) 09/06/2017  . History of ectopic pregnancy 06/19/2017  . Major depression in remission ([place]) 03/07/2017  . 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  By time of discharge on POD#2, her pain was controlled on oral pain medications; she had appropriate lochia and was ambulating, voiding without difficulty, tolerating regular diet and passing flatus.   She was deemed stable for  discharge to home.    Discharge Physical Exam:  BP 100/65 (BP Location: Right Arm)   Pulse 77   Temp 98.6 F (37 C) (Oral)   Resp 18   Ht 5\' 2"  (1.575 m)   Wt 84.4 kg   LMP 10/21/2017   SpO2 100%   Breastfeeding Unknown   BMI 34.02 kg/m   General: NAD CV: RRR Pulm: CTABL, nl effort ABD: s/nd/nt, fundus firm and below the umbilicus Lochia: moderate Incision: c/d/I DVT Evaluation: LE non-ttp, no evidence of DVT on exam.  Hemoglobin  Date Value Ref Range Status  07/07/2018 9.3 (L) 12.0 - 15.0 g/dL Final   HCT  Date Value Ref Range Status  07/07/2018 27.2 (L) 36.0 - 46.0 % Final     Disposition: stable, discharge to home. Baby Feeding: breastmilk Baby Disposition: home with mom  Rh Immune globulin given: n/a Rubella vaccine given: n/a Flu vaccine given in AP or PP setting: AP  Contraception: Nexplanon  Prenatal Labs:  Blood Type, Rh A pos  Antibody screen neg  Rubella Immune  Varicella Immune  RPR NR  HBsAg Neg  HIV NR  GC neg  Chlamydia neg  Genetic screening Negative NIPT, XX  1 hour GTT 110  3 hour GTT   GBS neg      Plan:  Suzanne Cross was discharged to home in good condition. Follow-up appointment with Dr. Leonides Schanz in 2 weeks.  Discharge Medications: Allergies as of 07/08/2018   No Known Allergies     Medication List    TAKE these medications   acetaminophen 500 MG tablet Commonly known as:  TYLENOL Take 2 tablets (1,000 mg total) by mouth every 6 (six) hours.   docusate sodium 100 MG capsule Commonly known as:  COLACE Take 2 capsules (200 mg  total) by mouth daily as needed for mild constipation. What changed:  how much to take   famotidine 20 MG tablet Commonly known as:  PEPCID Take 20 mg by mouth 2 (two) times daily.   ferrous sulfate 325 (65 FE) MG tablet Take 1 tablet (325 mg total) by mouth 2 (two) times daily with a meal for 30 days. What changed:  when to take this   ibuprofen 600 MG tablet Commonly known as:   ADVIL Take 1 tablet (600 mg total) by mouth every 6 (six) hours.   oxyCODONE 5 MG immediate release tablet Commonly known as:  Oxy IR/ROXICODONE Take 1 tablet (5 mg total) by mouth every 6 (six) hours as needed for up to 7 days for moderate pain.   PNV-DHA 27-0.6-0.4-300 MG Caps Take 1 capsule by mouth daily.       Follow-up Information    Ward, Honor Loh, MD Follow up in 2 week(s).   Specialty:  Obstetrics and Gynecology Why:  for incision and well-being check Contact information: [place] [address] [phone]           Signed: Francetta Found, CNM 07/08/2018 10:22 AM

## 2018-07-06 NOTE — H&P (Signed)
OB History & Physical   History of Present Illness:  Chief Complaint:   HPI:  Suzanne Cross is a 28 y.o. G76P0020 female at [redacted]w[redacted]d dated by Patient's last menstrual period was 10/21/2017. Estimated Date of Delivery: 07/24/18  She presents to L&D for scheduled primary cesarean due to prior uterine surgery at the [facility] for ectopic/interstitial pregnancy  +FM, no CTX, no LOF, no VB  Pregnancy Issues: 1.  Previous uterine surgery 2.  First degree relative with cognitive disabilities 3. History of anxiety and depression, no meds - currently stable. 4. Resolved fetal pylectasis  Maternal Medical History:   Past Medical History:  Diagnosis Date  . Acid reflux disease   . Anemia   . Chocolate cyst of ovary    Left  . Complication of anesthesia    LOW bp  . Ectopic pregnancy 2019, 2018  . Endometriosis   . Scoliosis     Past Surgical History:  Procedure Laterality Date  . DIAGNOSTIC LAPAROSCOPY WITH REMOVAL OF ECTOPIC PREGNANCY Left 06/02/2017   Procedure: DIAGNOSTIC LAPAROSCOPY WITH REMOVAL OF ECTOPIC PREGNANCY;  Surgeon: Ward, Honor Loh, MD;  Location: [facility] ORS;  Service: Gynecology;  Laterality: Left;  . ECTOPIC PREGNANCY SURGERY  02/2016  . LAPAROTOMY N/A 06/02/2017   Procedure: EXPLORATORY LAPAROTOMY;  Surgeon: Ward, Honor Loh, MD;  Location: [facility] ORS;  Service: Gynecology;  Laterality: N/A;  . LAPAROTOMY Left 09/2015  . LAPAROTOMY Left 08/2016    No Known Allergies  Prior to Admission medications   Medication Sig Start Date End Date Taking? Authorizing Provider  docusate sodium (COLACE) 100 MG capsule Take 100 mg by mouth daily as needed for mild constipation.  04/24/18  Yes [provider]  famotidine (PEPCID) 20 MG tablet Take 20 mg by mouth 2 (two) times daily.   Yes [provider]  ferrous sulfate 325 (65 FE) MG tablet Take 325 mg by mouth daily with breakfast.    Yes [provider]  Prenat w/o A-FE-Methfol-FA-DHA (PNV-DHA) 27-0.6-0.4-300  MG CAPS Take 1 capsule by mouth daily.    Yes [provider]     Prenatal care site: [place] History: She  reports that she has never smoked. She has never used smokeless tobacco. She reports previous alcohol use. She reports that she does not use drugs.  Family History: family history includes Arthritis in her mother; Clotting disorder in her maternal grandmother; Depression in her brother; Hypertension in her maternal grandmother and mother; Mental retardation in her sister; Obesity in her maternal grandmother; Pulmonary embolism in her maternal grandmother; Varicose Veins in her maternal grandmother, mother, and sister.   Review of Systems: A full review of systems was performed and negative except as noted in the HPI.     Physical Exam:  Vital Signs: BP 109/82 (BP Location: Left Arm)   Pulse (!) 103   Temp 98.2 F (36.8 C) (Oral)   Resp 18   Ht 5\' 2"  (1.575 m)   Wt 84.4 kg   LMP 10/21/2017   BMI 34.02 kg/m  General: no acute distress.  HEENT: normocephalic, atraumatic Heart: regular rate & rhythm.  No murmurs/rubs/gallops Lungs: clear to auscultation bilaterally, normal respiratory effort Abdomen: soft, gravid, non-tender;  EFW: 7lb 2oz Pelvic deferred    Extremities: non-tender, symmetric, mild edema bilaterally.  DTRs: 2+  Neurologic: Alert & oriented x 3.    Results for orders placed or performed during the hospital encounter of 07/06/18 (from the past 24 hour(s))  CBC     Status: Abnormal   Collection Time: 07/06/18  6:07 AM  Result Value Ref Range   WBC 11.6 (H) 4.0 - 10.5 K/uL   RBC 3.94 3.87 - 5.11 MIL/uL   Hemoglobin 12.1 12.0 - 15.0 g/dL   HCT 35.3 (L) 36.0 - 46.0 %   MCV 89.6 80.0 - 100.0 fL   MCH 30.7 26.0 - 34.0 pg   MCHC 34.3 30.0 - 36.0 g/dL   RDW 12.7 11.5 - 15.5 %   Platelets 221 150 - 400 K/uL   nRBC 0.0 0.0 - 0.2 %  Type and screen     Status: None (Preliminary result)   Collection Time: 07/06/18  6:44 AM   Result Value Ref Range   ABO/RH(D) PENDING    Antibody Screen PENDING    Sample Expiration      07/09/2018,2359 Performed at [facility], [address] [place], [place] [address]   OB RESULTS CONSOLE GC/Chlamydia     Status: None   Collection Time: 07/06/18  7:25 AM  Result Value Ref Range   Gonorrhea Negative    Chlamydia Negative   OB RESULTS CONSOLE HIV antibody     Status: None   Collection Time: 07/06/18  7:25 AM  Result Value Ref Range   HIV Non-reactive   OB RESULTS CONSOLE Rubella Antibody     Status: None   Collection Time: 07/06/18  7:25 AM  Result Value Ref Range   Rubella Immune   OB RESULTS CONSOLE Varicella zoster antibody, IgG     Status: None   Collection Time: 07/06/18  7:25 AM  Result Value Ref Range   Varicella Immune   OB RESULTS CONSOLE Hepatitis B surface antigen     Status: None   Collection Time: 07/06/18  7:25 AM  Result Value Ref Range   Hepatitis B Surface Ag Negative     Pertinent Results:  Prenatal Labs: Blood type/Rh A pos  Antibody screen neg  Rubella Immune  Varicella Immune  RPR NR  HBsAg Neg  HIV NR  GC neg  Chlamydia neg  Genetic screening Negative NIPT, XX  1 hour GTT 110  3 hour GTT   GBS neg   FHT: 130 mod +accels no decels TOCO: occasional    Cephalic by leopolds   Assessment:  Suzanne Cross is a 28 y.o. G25P0020 female at [redacted]w[redacted]d with scheduled cesarean due to former cornual myometrial incision.   Plan:  1. Admit to Labor & Delivery 2. CBC, T&S, NPO, IVF 3. GBS neg   4. Consents obtained. 5. Continuous efm/toco until OR 6. Category 1 7. To OR when ready  ----- Larey Days, MD Attending Obstetrician and Gynecologist [facility], Department of [facility]

## 2018-07-06 NOTE — Anesthesia Procedure Notes (Signed)
Spinal  Patient location during procedure: OR Start time: 07/06/2018 8:25 AM End time: 07/06/2018 8:35 AM Staffing Anesthesiologist: Emmie Niemann, MD Performed: anesthesiologist  Preanesthetic Checklist Completed: patient identified, site marked, surgical consent, pre-op evaluation, timeout performed, IV checked, risks and benefits discussed and monitors and equipment checked Spinal Block Patient position: sitting Prep: ChloraPrep Patient monitoring: heart rate, continuous pulse ox, blood pressure and cardiac monitor Approach: midline Location: L4-5 Injection technique: single-shot Needle Needle type: Introducer and Pencil-Tip  Needle gauge: 24 G Needle length: 9 cm Additional Notes Negative paresthesia. Negative blood return. Positive free-flowing CSF. Expiration date of kit checked and confirmed. Patient tolerated procedure well, without complications.

## 2018-07-06 NOTE — Anesthesia Procedure Notes (Addendum)
Date/Time: 07/06/2018 8:41 AM Performed by: Allean Found, CRNA Pre-anesthesia Checklist: Patient identified, Emergency Drugs available, Suction available, Patient being monitored and Timeout performed Patient Re-evaluated:Patient Re-evaluated prior to induction Oxygen Delivery Method: Nasal cannula Placement Confirmation: positive ETCO2

## 2018-07-06 NOTE — Lactation Note (Signed)
This note was copied from a baby's chart. Lactation Consultation Note  Patient Name: Suzanne Cross [id number] Date: 07/06/2018 Reason for consult: Follow-up assessment;Primapara Assisted  With first feeding in birthplace and baby latched easily and nursed well, now multiple attempts made at feeding and baby sleepy and not waking, also gagging and swallowing and spitting mucous   Maternal Data Formula Feeding for Exclusion: No Has patient been taught Hand Expression?: Yes Does the patient have breastfeeding experience prior to this delivery?: No  Feeding Feeding Type: Breast Fed Awakening at this attempt, showing cues, latched easily and nursing with swallows LATCH Score Latch: Grasps breast easily, tongue down, lips flanged, rhythmical sucking.  Audible Swallowing: Spontaneous and intermittent  Type of Nipple: Everted at rest and after stimulation  Comfort (Breast/Nipple): Soft / non-tender  Hold (Positioning): Assistance needed to correctly position infant at breast and maintain latch.  LATCH Score: 9  Interventions Interventions: Assisted with latch;Skin to skin;Breast compression;Support pillows  Lactation Tools Discussed/Used WIC Program: No   Consult Status Consult Status: Follow-up Date: 07/07/18 Follow-up type: In-patient    Ferol Luz 07/06/2018, 9:08 PM

## 2018-07-06 NOTE — Anesthesia Post-op Follow-up Note (Signed)
Anesthesia QCDR form completed.        

## 2018-07-06 NOTE — Discharge Instructions (Signed)
Discharge instructions:   Call office if you have any of the following: headache, visual changes, fever >101.0 F, chills, shortness of breath, breast concerns, excessive vaginal bleeding, incision drainage or problems, leg pain or redness, depression or any other concerns.   Activity: Do not lift > 10 lbs for 6 weeks.  No intercourse or tampons for 6 weeks.  No driving for 1-2 weeks.   Call your doctor for increased pain or vaginal bleeding, temperature above 101.0, depression, or concerns.  No strenuous activity or heavy lifting for 6 weeks.  No intercourse, tampons, douching, or enemas for 6 weeks.  No tub baths-showers only.  No driving for 2 weeks or while taking pain medications.  Continue prenatal vitamin and iron.  Increase calories and fluids while breastfeeding.  You may have a slight fever when your milk comes in, but it should go away on its own.  If it does not, and rises above 101.0 please call the doctor.  For concerns about your baby, please call your pediatrician For breastfeeding concerns, the lactation consultant can be reached at [phone]

## 2018-07-07 LAB — CBC
HCT: 27.2 % — ABNORMAL LOW (ref 36.0–46.0)
Hemoglobin: 9.3 g/dL — ABNORMAL LOW (ref 12.0–15.0)
MCH: 31 pg (ref 26.0–34.0)
MCHC: 34.2 g/dL (ref 30.0–36.0)
MCV: 90.7 fL (ref 80.0–100.0)
Platelets: 162 10*3/uL (ref 150–400)
RBC: 3 MIL/uL — ABNORMAL LOW (ref 3.87–5.11)
RDW: 12.8 % (ref 11.5–15.5)
WBC: 12.9 10*3/uL — ABNORMAL HIGH (ref 4.0–10.5)
nRBC: 0 % (ref 0.0–0.2)

## 2018-07-07 LAB — RPR: RPR Ser Ql: NONREACTIVE

## 2018-07-07 NOTE — Anesthesia Postprocedure Evaluation (Signed)
Anesthesia Post Note  Patient: Suzanne Cross  Procedure(s) Performed: CESAREAN SECTION (N/A )  Patient location during evaluation: Mother Baby Anesthesia Type: Spinal Level of consciousness: awake Respiratory status: spontaneous breathing Cardiovascular status: stable Postop Assessment: no headache, able to ambulate and no backache Anesthetic complications: no     Last Vitals:  Vitals:   07/07/18 0905 07/07/18 1157  BP:  (!) 97/51  Pulse: 81   Resp:    Temp:  37.2 C  SpO2: 96%     Last Pain:  Vitals:   07/07/18 1157  TempSrc: Oral  PainSc:                  Durenda Hurt

## 2018-07-07 NOTE — Anesthesia Postprocedure Evaluation (Signed)
Anesthesia Post Note  Patient: Suzanne Cross  Procedure(s) Performed: CESAREAN SECTION (N/A )  Anesthesia Type: Spinal     Last Vitals:  Vitals:   07/07/18 0905 07/07/18 1157  BP:  (!) 97/51  Pulse: 81   Resp:    Temp:  37.2 C  SpO2: 96%     Last Pain:  Vitals:   07/07/18 1157  TempSrc: Oral  PainSc:                  Durenda Hurt

## 2018-07-07 NOTE — Progress Notes (Signed)
Post Partum Day 1 Subjective: Doing well, no complaints.  Tolerating regular diet, pain with PO meds, voiding and ambulating without difficulty.  No CP SOB Fever,Chills, N/V or leg pain; denies nipple or breast pain; no HA change of vision, RUQ/epigastric pain  Objective: BP (!) 97/51 (BP Location: Left Arm) Comment: nurse Lazarus Gowda notified.  Pulse 81   Temp 98.9 F (37.2 C) (Oral)   Resp 20   Ht 5\' 2"  (1.575 m)   Wt 84.4 kg   LMP 10/21/2017   SpO2 96% Comment: Room Air  Breastfeeding Unknown   BMI 34.02 kg/m    Physical Exam:  General: NAD Breasts: soft/nontender CV: RRR Pulm: nl effort, CTABL Abdomen: soft, NT, BS x 4 Incision: Incision CDI, no erythema or drainage Lochia: small Uterine Fundus: fundus firm and 2 fb below umbilicus DVT Evaluation: no cords, ttp LEs   Recent Labs    07/06/18 0607 07/07/18 0703  HGB 12.1 9.3*  HCT 35.3* 27.2*  WBC 11.6* 12.9*  PLT 221 162    Assessment/Plan: 28 y.o. [id number] postpartum day # 1  - Continue routine PP care - Lactation consult prn.  - Acute blood loss anemia - hemodynamically stable and asymptomatic; start po ferrous sulfate BID with stool softeners  - Immunization status: all Imms up to date    Disposition: Does not desire Dc home today.     Francetta Found, CNM 07/07/2018  12:51 PM

## 2018-07-07 NOTE — Lactation Note (Signed)
This note was copied from a baby's chart. Lactation Consultation Note  Patient Name: Suzanne Cross [id number] Date: 07/07/2018 Reason for consult: Follow-up assessment;Mother's request;Primapara;Early term 37-38.6wks;Infant < 6lbs Assisted mom with breastfeeding.  Mom not aggressive and [name] tends to get shallow latch with pinching.  Stressed importance of deep latch and keeping her awake for productive sucking.  Reviewed supply and demand, normal course of lactation, routine newborn feeding patterns and feeding cues.  Lactation name and number written on white board and encouraged to call with any questions, concerns or assistance. Maternal Data Formula Feeding for Exclusion: No Has patient been taught Hand Expression?: Yes Does the patient have breastfeeding experience prior to this delivery?: No  Feeding Feeding Type: Breast Fed  LATCH Score Latch: Repeated attempts needed to sustain latch, nipple held in mouth throughout feeding, stimulation needed to elicit sucking reflex.  Audible Swallowing: A few with stimulation  Type of Nipple: Everted at rest and after stimulation  Comfort (Breast/Nipple): Soft / non-tender  Hold (Positioning): Assistance needed to correctly position infant at breast and maintain latch.  LATCH Score: 7  Interventions Interventions: Breast feeding basics reviewed;Assisted with latch;Skin to skin;Breast massage;Hand express;Breast compression;Adjust position;Support pillows;Position options  Lactation Tools Discussed/Used WIC Program: Yes   Consult Status Consult Status: Follow-up Follow-up type: Call as needed    Jarold Motto 07/07/2018, 8:18 PM

## 2018-07-08 MED ORDER — IBUPROFEN 600 MG PO TABS: 600.0000 mg | ORAL_TABLET | Freq: Four times a day (QID) | ORAL | 0 refills | Status: DC

## 2018-07-08 MED ORDER — ACETAMINOPHEN 500 MG PO TABS: 1000.0000 mg | ORAL_TABLET | Freq: Four times a day (QID) | ORAL | 0 refills | Status: DC

## 2018-07-08 MED ORDER — IBUPROFEN 600 MG PO TABS
600.0000 mg | ORAL_TABLET | Freq: Four times a day (QID) | ORAL | Status: DC
  Administered 2018-07-08 (×2): 600 mg via ORAL
  Filled 2018-07-08 (×2): qty 1

## 2018-07-08 MED ORDER — ACETAMINOPHEN 500 MG PO TABS
1000.0000 mg | ORAL_TABLET | Freq: Four times a day (QID) | ORAL | Status: DC
  Administered 2018-07-08 (×2): 1000 mg via ORAL
  Filled 2018-07-08 (×2): qty 2

## 2018-07-08 MED ORDER — FERROUS SULFATE 325 (65 FE) MG PO TABS: 325.0000 mg | ORAL_TABLET | Freq: Two times a day (BID) | ORAL | 0 refills | Status: DC

## 2018-07-08 MED ORDER — DOCUSATE SODIUM 100 MG PO CAPS: 200.0000 mg | ORAL_CAPSULE | Freq: Every day | ORAL | 0 refills | Status: DC | PRN

## 2018-07-08 MED ORDER — OXYCODONE HCL 5 MG PO TABS: 5.0000 mg | ORAL_TABLET | Freq: Four times a day (QID) | ORAL | 0 refills | Status: AC | PRN

## 2018-07-08 NOTE — Progress Notes (Signed)
Post Partum Day 2 Subjective: Doing well, no complaints.  Tolerating regular diet, pain with PO meds, voiding and ambulating without difficulty.  No CP SOB Fever,Chills, N/V or leg pain; denies nipple or breast pain; no HA change of vision, RUQ/epigastric pain  Objective: BP 100/65 (BP Location: Right Arm)   Pulse 77   Temp 98.6 F (37 C) (Oral)   Resp 18   Ht 5\' 2"  (1.575 m)   Wt 84.4 kg   LMP 10/21/2017   SpO2 100%   Breastfeeding Unknown   BMI 34.02 kg/m    Physical Exam:  General: NAD Breasts: soft/nontender CV: RRR Pulm: nl effort, CTABL Abdomen: soft, NT, BS x 4 Incision: Incision CDI, no erythema or drainage Lochia: small Uterine Fundus: fundus firm and 2 fb below umbilicus DVT Evaluation: no cords, ttp LEs   Recent Labs    07/06/18 0607 07/07/18 0703  HGB 12.1 9.3*  HCT 35.3* 27.2*  WBC 11.6* 12.9*  PLT 221 162    Assessment/Plan: 28 y.o. [id number] postpartum day # 2  - Continue routine PP care - Lactation consult prn.  - Discussed contraceptive options including implant, IUDs hormonal and non-hormonal, injection, pills/ring/patch, condoms, and NFP. Desires nexplanon - Acute blood loss anemia - hemodynamically stable and asymptomatic; continue po ferrous sulfate BID with stool softeners  - Immunization status: all Imms up to date   Disposition: Does desire Dc home today.     Francetta Found, CNM 07/08/2018  10:19 AM

## 2018-07-08 NOTE — Progress Notes (Signed)
Reviewed D/C instructions with pt and family. Pt verbalized understanding of teaching. Discharged to home via W/C. Pt to schedule f/u appt.  

## 2018-07-08 NOTE — Lactation Note (Signed)
This note was copied from a baby's chart. Lactation Consultation Note  Patient Name: Suzanne Cross ZWCHE'N Date: 07/08/2018   Mom has been having difficulty getting Suzanne Cross to latch to the left breast, so has been primarily putting her to the right breast.  Mom has been committed to putting Suzanne Cross to the breast any time she has demonstrated feeding cues.  Mom asking for help with gettting her on the left breast now.  Mom's breasts feel fuller than yesterday.  Positioned mom in comfortable position in football hold with plenty of pillow support.  Suzanne Cross latched with minimal assistance and began strong rhythmic sucking with audible gulps.  Mom's breasts and nipples are tender.  Demonstrated how to hand express milk and rub on nipples.  Coconut oil and comfort gels given and instructed in use.  Reviewed difference between full breasts, engorgement and mastitis and discussed how to prevent or treat.  Encouraged mom to continue to breast feed frequently to bring mature milk on in, ensure a plentiful milk supply and prevent engorgement.  Lactation community resources available after discharge reviewed and contact numbers given and encouraged to call with any questions, concerns or assistance.    Maternal Data    Feeding    LATCH Score                   Interventions    Lactation Tools Discussed/Used     Consult Status      Suzanne Cross 07/08/2018, 4:40 PM

## 2018-07-20 DIAGNOSIS — R300 Dysuria: Secondary | ICD-10-CM | POA: Diagnosis not present

## 2018-08-08 DIAGNOSIS — F53 Postpartum depression: Secondary | ICD-10-CM | POA: Diagnosis not present

## 2018-08-16 DIAGNOSIS — Z98891 History of uterine scar from previous surgery: Secondary | ICD-10-CM | POA: Diagnosis not present

## 2018-08-16 DIAGNOSIS — Z1332 Encounter for screening for maternal depression: Secondary | ICD-10-CM | POA: Diagnosis not present

## 2018-08-16 DIAGNOSIS — F53 Postpartum depression: Secondary | ICD-10-CM | POA: Diagnosis not present

## 2018-08-16 DIAGNOSIS — Z3202 Encounter for pregnancy test, result negative: Secondary | ICD-10-CM | POA: Diagnosis not present

## 2018-08-16 DIAGNOSIS — Z30430 Encounter for insertion of intrauterine contraceptive device: Secondary | ICD-10-CM | POA: Diagnosis not present

## 2018-08-16 DIAGNOSIS — Z113 Encounter for screening for infections with a predominantly sexual mode of transmission: Secondary | ICD-10-CM | POA: Diagnosis not present

## 2018-08-16 DIAGNOSIS — Z01419 Encounter for gynecological examination (general) (routine) without abnormal findings: Secondary | ICD-10-CM | POA: Diagnosis not present

## 2018-08-19 DIAGNOSIS — F53 Postpartum depression: Secondary | ICD-10-CM | POA: Insufficient documentation

## 2018-08-28 DIAGNOSIS — O99345 Other mental disorders complicating the puerperium: Secondary | ICD-10-CM | POA: Diagnosis not present

## 2018-08-28 DIAGNOSIS — Z01818 Encounter for other preprocedural examination: Secondary | ICD-10-CM | POA: Diagnosis not present

## 2018-08-28 DIAGNOSIS — F53 Postpartum depression: Secondary | ICD-10-CM | POA: Diagnosis not present

## 2018-08-28 DIAGNOSIS — Z01812 Encounter for preprocedural laboratory examination: Secondary | ICD-10-CM | POA: Diagnosis not present

## 2018-09-08 DIAGNOSIS — Z1159 Encounter for screening for other viral diseases: Secondary | ICD-10-CM | POA: Diagnosis not present

## 2018-09-11 DIAGNOSIS — F53 Postpartum depression: Secondary | ICD-10-CM | POA: Diagnosis not present

## 2018-09-11 DIAGNOSIS — Z1159 Encounter for screening for other viral diseases: Secondary | ICD-10-CM | POA: Diagnosis not present

## 2018-09-11 DIAGNOSIS — R197 Diarrhea, unspecified: Secondary | ICD-10-CM | POA: Diagnosis not present

## 2018-09-12 DIAGNOSIS — Z8659 Personal history of other mental and behavioral disorders: Secondary | ICD-10-CM | POA: Diagnosis not present

## 2018-09-12 DIAGNOSIS — F53 Postpartum depression: Secondary | ICD-10-CM | POA: Diagnosis not present

## 2018-09-13 DIAGNOSIS — F53 Postpartum depression: Secondary | ICD-10-CM | POA: Diagnosis not present

## 2018-09-14 DIAGNOSIS — F53 Postpartum depression: Secondary | ICD-10-CM | POA: Diagnosis not present

## 2018-09-14 MED ORDER — FERROUS SULFATE 325 (65 FE) MG PO TABS
325.00 | ORAL_TABLET | ORAL | Status: DC
Start: 2018-09-14 — End: 2018-09-14

## 2018-09-14 MED ORDER — PNV PRENATAL PLUS MULTIVITAMIN 27-1 MG PO TABS
1.00 | ORAL_TABLET | ORAL | Status: DC
Start: 2018-09-14 — End: 2018-09-14

## 2018-09-14 MED ORDER — ACETAMINOPHEN 325 MG PO TABS
650.00 | ORAL_TABLET | ORAL | Status: DC
Start: ? — End: 2018-09-14

## 2018-09-17 DIAGNOSIS — O99345 Other mental disorders complicating the puerperium: Secondary | ICD-10-CM | POA: Diagnosis not present

## 2018-09-17 DIAGNOSIS — Z30431 Encounter for routine checking of intrauterine contraceptive device: Secondary | ICD-10-CM | POA: Diagnosis not present

## 2018-09-17 DIAGNOSIS — F53 Postpartum depression: Secondary | ICD-10-CM | POA: Diagnosis not present

## 2018-09-24 DIAGNOSIS — F411 Generalized anxiety disorder: Secondary | ICD-10-CM | POA: Diagnosis not present

## 2018-09-24 DIAGNOSIS — F53 Postpartum depression: Secondary | ICD-10-CM | POA: Diagnosis not present

## 2018-10-01 DIAGNOSIS — F4322 Adjustment disorder with anxiety: Secondary | ICD-10-CM | POA: Diagnosis not present

## 2018-10-15 DIAGNOSIS — F4322 Adjustment disorder with anxiety: Secondary | ICD-10-CM | POA: Diagnosis not present

## 2018-10-22 DIAGNOSIS — F4322 Adjustment disorder with anxiety: Secondary | ICD-10-CM | POA: Diagnosis not present

## 2018-10-29 DIAGNOSIS — F411 Generalized anxiety disorder: Secondary | ICD-10-CM | POA: Diagnosis not present

## 2018-10-29 DIAGNOSIS — O99345 Other mental disorders complicating the puerperium: Secondary | ICD-10-CM | POA: Diagnosis not present

## 2018-10-29 DIAGNOSIS — F53 Postpartum depression: Secondary | ICD-10-CM | POA: Diagnosis not present

## 2018-11-12 DIAGNOSIS — F4322 Adjustment disorder with anxiety: Secondary | ICD-10-CM | POA: Diagnosis not present

## 2018-11-26 DIAGNOSIS — F4322 Adjustment disorder with anxiety: Secondary | ICD-10-CM | POA: Diagnosis not present

## 2018-12-10 DIAGNOSIS — O99345 Other mental disorders complicating the puerperium: Secondary | ICD-10-CM | POA: Diagnosis not present

## 2018-12-10 DIAGNOSIS — F419 Anxiety disorder, unspecified: Secondary | ICD-10-CM | POA: Diagnosis not present

## 2018-12-10 DIAGNOSIS — F53 Postpartum depression: Secondary | ICD-10-CM | POA: Diagnosis not present

## 2018-12-17 DIAGNOSIS — F4322 Adjustment disorder with anxiety: Secondary | ICD-10-CM | POA: Diagnosis not present

## 2019-01-02 DIAGNOSIS — F4322 Adjustment disorder with anxiety: Secondary | ICD-10-CM | POA: Diagnosis not present

## 2019-01-15 DIAGNOSIS — O99345 Other mental disorders complicating the puerperium: Secondary | ICD-10-CM | POA: Diagnosis not present

## 2019-01-15 DIAGNOSIS — F419 Anxiety disorder, unspecified: Secondary | ICD-10-CM | POA: Diagnosis not present

## 2019-01-15 DIAGNOSIS — F53 Postpartum depression: Secondary | ICD-10-CM | POA: Diagnosis not present

## 2019-01-28 DIAGNOSIS — F4322 Adjustment disorder with anxiety: Secondary | ICD-10-CM | POA: Diagnosis not present

## 2019-03-04 DIAGNOSIS — Z20828 Contact with and (suspected) exposure to other viral communicable diseases: Secondary | ICD-10-CM | POA: Diagnosis not present

## 2019-04-08 DIAGNOSIS — Z20828 Contact with and (suspected) exposure to other viral communicable diseases: Secondary | ICD-10-CM | POA: Diagnosis not present

## 2019-04-25 ENCOUNTER — Ambulatory Visit: Payer: Medicaid Other | Attending: Internal Medicine

## 2019-04-25 DIAGNOSIS — Z23 Encounter for immunization: Secondary | ICD-10-CM

## 2019-04-25 NOTE — Progress Notes (Signed)
   Covid-19 Vaccination Clinic  Name:  Suzanne Cross    MRN: [id number] DOB: 06-22-1990  04/25/2019  [name] was observed post Covid-19 immunization for 15 minutes without incident. She was provided with Vaccine Information Sheet and instruction to access the V-Safe system.   [name] was instructed to call 911 with any severe reactions post vaccine: Marland Kitchen Difficulty breathing  . Swelling of face and throat  . A fast heartbeat  . A bad rash all over body  . Dizziness and weakness   Immunizations Administered    Name Date Dose VIS Date Route   Pfizer COVID-19 Vaccine 04/25/2019 12:27 PM 0.3 mL 01/11/2019 Intramuscular   Manufacturer: [place]   Lot: [id number]   [place]: [id number]

## 2019-05-22 ENCOUNTER — Ambulatory Visit: Payer: Medicaid Other | Attending: Internal Medicine

## 2019-05-22 DIAGNOSIS — Z23 Encounter for immunization: Secondary | ICD-10-CM

## 2019-05-22 NOTE — Progress Notes (Signed)
   Covid-19 Vaccination Clinic  Name:  Michaelina Tamburo    MRN: [id number] DOB: [birth year]  05/22/2019  [name] was observed post Covid-19 immunization for 15 minutes without incident. She was provided with Vaccine Information Sheet and instruction to access the V-Safe system.   [name] was instructed to call 911 with any severe reactions post vaccine: Marland Kitchen Difficulty breathing  . Swelling of face and throat  . A fast heartbeat  . A bad rash all over body  . Dizziness and weakness   Immunizations Administered    Name Date Dose VIS Date Route   Pfizer COVID-19 Vaccine 05/22/2019 11:21 AM 0.3 mL 03/27/2018 Intramuscular   Manufacturer: [place]   Lot: [id number]   [place]: [id number]

## 2019-06-07 ENCOUNTER — Encounter: Payer: Self-pay | Admitting: Family Medicine

## 2019-06-07 NOTE — Telephone Encounter (Signed)
Pt has an appt.

## 2019-06-12 ENCOUNTER — Encounter: Payer: Self-pay | Admitting: Family Medicine

## 2019-06-12 ENCOUNTER — Other Ambulatory Visit: Payer: Self-pay

## 2019-06-12 ENCOUNTER — Ambulatory Visit: Payer: Medicaid Other | Admitting: Family Medicine

## 2019-06-12 VITALS — BP 112/72 | HR 99 | Temp 97.1°F | Resp 16 | Ht 62.0 in | Wt 177.8 lb

## 2019-06-12 DIAGNOSIS — Z862 Personal history of diseases of the blood and blood-forming organs and certain disorders involving the immune mechanism: Secondary | ICD-10-CM | POA: Diagnosis not present

## 2019-06-12 DIAGNOSIS — Z975 Presence of (intrauterine) contraceptive device: Secondary | ICD-10-CM | POA: Diagnosis not present

## 2019-06-12 DIAGNOSIS — K2100 Gastro-esophageal reflux disease with esophagitis, without bleeding: Secondary | ICD-10-CM | POA: Diagnosis not present

## 2019-06-12 DIAGNOSIS — Z131 Encounter for screening for diabetes mellitus: Secondary | ICD-10-CM | POA: Diagnosis not present

## 2019-06-12 DIAGNOSIS — Z13220 Encounter for screening for lipoid disorders: Secondary | ICD-10-CM | POA: Diagnosis not present

## 2019-06-12 DIAGNOSIS — M4124 Other idiopathic scoliosis, thoracic region: Secondary | ICD-10-CM | POA: Diagnosis not present

## 2019-06-12 DIAGNOSIS — R946 Abnormal results of thyroid function studies: Secondary | ICD-10-CM

## 2019-06-12 MED ORDER — FAMOTIDINE 20 MG PO TABS: 20.0000 mg | ORAL_TABLET | Freq: Two times a day (BID) | ORAL | 5 refills | Status: DC

## 2019-06-12 NOTE — Progress Notes (Signed)
Name: Suzanne Cross   MRN: [id number]    DOB: 21-Aug-1990   Date:06/12/2019       Progress Note  Subjective  Chief Complaint  Chief Complaint  Patient presents with  . Gastroesophageal Reflux     mild chest discomfort , burping, burning in throat     HPI  GERD: she used to take Pepcid but ran out of medication a long time ago. Recently seen by dentist and started on Augmentin for a tooth abscess. Today is the last day of prescription. Last week she noticed increase in regurgitation and heart burn, she would like a refill of medication  History of MDD and severe post-partum depression that required short inpatient admission last year. She states doing well now, bonding well with her daughter, phq 9 negative today   History of iron deficiency anemia: we will recheck labs, denies sob or palpitation   Patient Active Problem List   Diagnosis Date Noted  . Labor and delivery indication for care or intervention 07/06/2018  . Family history of developmental delay   . Endometriosis 09/06/2017  . Other idiopathic scoliosis, thoracic region 09/06/2017  . Overweight (BMI 25.0-29.9) 09/06/2017  . History of ectopic pregnancy 06/19/2017  . Major depression in remission ([place]) 03/07/2017  . 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 CESAREAN SECTION N/A 07/06/2018   Procedure: CESAREAN SECTION;  Surgeon: Ward, Honor Loh, MD;  Location: [facility] ORS;  Service: Obstetrics;  Laterality: N/A;  . DIAGNOSTIC LAPAROSCOPY WITH REMOVAL OF ECTOPIC PREGNANCY Left 06/02/2017   Procedure: DIAGNOSTIC LAPAROSCOPY WITH REMOVAL OF ECTOPIC PREGNANCY;  Surgeon: Ward, Honor Loh, MD;  Location: [facility] ORS;  Service: Gynecology;  Laterality: Left;  . ECTOPIC PREGNANCY SURGERY  02/2016  . LAPAROTOMY N/A 06/02/2017   Procedure: EXPLORATORY LAPAROTOMY;  Surgeon: Ward, Honor Loh, MD;  Location: [facility] ORS;  Service: Gynecology;  Laterality: N/A;  . LAPAROTOMY Left 09/2015  .  LAPAROTOMY Left 08/2016    Family History  Problem Relation Age of Onset  . Hypertension Mother   . Arthritis Mother   . Varicose Veins Mother   . Mental retardation Sister   . Depression Brother   . Clotting disorder Maternal Grandmother   . Pulmonary embolism Maternal Grandmother   . Hypertension Maternal Grandmother   . Varicose Veins Maternal Grandmother   . Obesity Maternal Grandmother   . Varicose Veins Sister     Social History   Tobacco Use  . Smoking status: Never Smoker  . Smokeless tobacco: Never Used  Substance Use Topics  . Alcohol use: Not Currently     Current Outpatient Medications:  .  amoxicillin-clavulanate (AUGMENTIN) 500-125 MG tablet, Take 1 tablet by mouth 2 (two) times daily. From dental office, Disp: , Rfl:  .  ibuprofen (ADVIL) 600 MG tablet, Take 1 tablet (600 mg total) by mouth every 6 (six) hours., Disp: 30 tablet, Rfl: 0 .  acetaminophen (TYLENOL) 500 MG tablet, Take 2 tablets (1,000 mg total) by mouth every 6 (six) hours., Disp: 30 tablet, Rfl: 0 .  docusate sodium (COLACE) 100 MG capsule, Take 2 capsules (200 mg total) by mouth daily as needed for mild constipation., Disp: 10 capsule, Rfl: 0 .  famotidine (PEPCID) 20 MG tablet, Take 20 mg by mouth 2 (two) times daily., Disp: , Rfl:  .  ferrous sulfate 325 (65 FE) MG tablet, Take 1 tablet (325 mg total) by mouth 2 (two) times daily with a meal for 30 days.,  Disp: 60 tablet, Rfl: 0 .  Prenat w/o A-FE-Methfol-FA-DHA (PNV-DHA) 27-0.6-0.4-300 MG CAPS, Take 1 capsule by mouth daily. , Disp: , Rfl:   No Known Allergies  I personally reviewed active problem list, medication list, allergies, family history, social history, health maintenance with the patient/caregiver today.   ROS  Constitutional: Negative for fever or weight change.  Respiratory: Negative for cough and shortness of breath.   Cardiovascular: Negative for chest pain or palpitations.  Gastrointestinal: Negative for abdominal pain,  no bowel changes.  Musculoskeletal: Negative for gait problem or joint swelling.  Skin: Negative for rash.  Neurological: Negative for dizziness or headache.  No other specific complaints in a complete review of systems (except as listed in HPI above).  Objective  Vitals:   06/12/19 0917  BP: 112/72  Pulse: 99  Resp: 16  Temp: (!) 97.1 F (36.2 C)  TempSrc: Temporal  SpO2: 98%  Weight: 177 lb 12.8 oz (80.6 kg)  Height: 5\' 2"  (1.575 m)    Body mass index is 32.52 kg/m.  Physical Exam  Constitutional: Patient appears well-developed and well-nourished. Obese  No distress.  HEENT: head atraumatic, normocephalic, pupils equal and reactive to light Cardiovascular: Normal rate, regular rhythm and normal heart sounds.  No murmur heard. No BLE edema. Pulmonary/Chest: Effort normal and breath sounds normal. No respiratory distress. Abdominal: Soft.  There is no tenderness. Muscular skeletal: scoliosis of thoracic spine  Psychiatric: Patient has a normal mood and affect. behavior is normal. Judgment and thought content normal.  PHQ2/9: Depression screen [facility] 2/9 06/12/2019 03/02/2018 09/06/2017  Decreased Interest 0 0 0  Down, Depressed, Hopeless 0 0 0  PHQ - 2 Score 0 0 0  Altered sleeping 0 - 1  Tired, decreased energy 0 - 0  Change in appetite 0 - 0  Feeling bad or failure about yourself  0 - 0  Trouble concentrating 0 - 0  Moving slowly or fidgety/restless 0 - 0  Suicidal thoughts 0 - 0  PHQ-9 Score 0 - 1  Difficult doing work/chores Not difficult at all - Not difficult at all    phq 9 is negative   Fall Risk: Fall Risk  06/12/2019 03/02/2018 01/11/2018 11/02/2017 11/02/2017  Falls in the past year? 0 0 0 No No  Number falls in past yr: 0 - - - -  Injury with Fall? 0 - - - -  Follow up Falls evaluation completed - - - -     Assessment & Plan  1. Gastroesophageal reflux disease with esophagitis without hemorrhage  - COMPLETE METABOLIC PANEL WITH GFR - famotidine  (PEPCID) 20 MG tablet; Take 1 tablet (20 mg total) by mouth 2 (two) times daily.  Dispense: 60 tablet; Refill: 5  She was taking ibuprofen for tooth pain and it may also have been the cause of flare   2. Diabetes mellitus screening  - Hemoglobin A1c  3. Lipid screening  - Lipid panel  4. IUD contraception  Doing well now  5. History of anemia  - CBC with Differential/Platelet - Iron, TIBC and Ferritin Panel  6. Other idiopathic scoliosis, thoracic region  Doing okay at this time  7. Abnormal thyroid exam  - Thyroid Panel With TSH

## 2019-06-13 LAB — COMPLETE METABOLIC PANEL WITH GFR
AG Ratio: 1.8 (calc) (ref 1.0–2.5)
ALT: 13 U/L (ref 6–29)
AST: 18 U/L (ref 10–30)
Albumin: 4.5 g/dL (ref 3.6–5.1)
Alkaline phosphatase (APISO): 50 U/L (ref 31–125)
BUN: 8 mg/dL (ref 7–25)
CO2: 28 mmol/L (ref 20–32)
Calcium: 9.3 mg/dL (ref 8.6–10.2)
Chloride: 104 mmol/L (ref 98–110)
Creat: 0.54 mg/dL (ref 0.50–1.10)
GFR, Est African American: 149 mL/min/{1.73_m2} (ref >=60)
GFR, Est Non African American: 128 mL/min/{1.73_m2} (ref >=60)
Globulin: 2.5 g/dL (calc) (ref 1.9–3.7)
Glucose, Bld: 97 mg/dL (ref 65–99)
Potassium: 4.1 mmol/L (ref 3.5–5.3)
Sodium: 139 mmol/L (ref 135–146)
Total Bilirubin: 1.1 mg/dL (ref 0.2–1.2)
Total Protein: 7 g/dL (ref 6.1–8.1)

## 2019-06-13 LAB — LIPID PANEL
Cholesterol: 168 mg/dL (ref <200)
HDL: 56 mg/dL (ref >=50)
LDL Cholesterol (Calc): 97 mg/dL (calc)
Non-HDL Cholesterol (Calc): 112 mg/dL (calc) (ref <130)
Total CHOL/HDL Ratio: 3 (calc) (ref <5.0)
Triglycerides: 65 mg/dL (ref <150)

## 2019-06-13 LAB — CBC WITH DIFFERENTIAL/PLATELET
Absolute Monocytes: 339 cells/uL (ref 200–950)
Basophils Absolute: 13 cells/uL (ref 0–200)
Basophils Relative: 0.2 %
Eosinophils Absolute: 83 cells/uL (ref 15–500)
Eosinophils Relative: 1.3 %
HCT: 41.4 % (ref 35.0–45.0)
Hemoglobin: 13.6 g/dL (ref 11.7–15.5)
Lymphs Abs: 1939 cells/uL (ref 850–3900)
MCH: 28.8 pg (ref 27.0–33.0)
MCHC: 32.9 g/dL (ref 32.0–36.0)
MCV: 87.5 fL (ref 80.0–100.0)
MPV: 10.7 fL (ref 7.5–12.5)
Monocytes Relative: 5.3 %
Neutro Abs: 4026 cells/uL (ref 1500–7800)
Neutrophils Relative %: 62.9 %
Platelets: 342 10*3/uL (ref 140–400)
RBC: 4.73 10*6/uL (ref 3.80–5.10)
RDW: 12.5 % (ref 11.0–15.0)
Total Lymphocyte: 30.3 %
WBC: 6.4 10*3/uL (ref 3.8–10.8)

## 2019-06-13 LAB — IRON,TIBC AND FERRITIN PANEL
%SAT: 28 % (calc) (ref 16–45)
Ferritin: 39 ng/mL (ref 16–154)
Iron: 99 ug/dL (ref 40–190)
TIBC: 352 mcg/dL (calc) (ref 250–450)

## 2019-06-13 LAB — THYROID PANEL WITH TSH
Free Thyroxine Index: 3.2 (ref 1.4–3.8)
T3 Uptake: 30 % (ref 22–35)
T4, Total: 10.7 ug/dL (ref 5.1–11.9)
TSH: 0.84 mIU/L

## 2019-06-13 LAB — HEMOGLOBIN A1C
Hgb A1c MFr Bld: 5.1 % of total Hgb (ref <5.7)
Mean Plasma Glucose: 100 (calc)
eAG (mmol/L): 5.5 (calc)

## 2019-08-23 DIAGNOSIS — R102 Pelvic and perineal pain: Secondary | ICD-10-CM | POA: Diagnosis not present

## 2019-08-23 DIAGNOSIS — Z1331 Encounter for screening for depression: Secondary | ICD-10-CM | POA: Diagnosis not present

## 2019-08-23 DIAGNOSIS — Z01419 Encounter for gynecological examination (general) (routine) without abnormal findings: Secondary | ICD-10-CM | POA: Diagnosis not present

## 2019-08-23 DIAGNOSIS — Z30432 Encounter for removal of intrauterine contraceptive device: Secondary | ICD-10-CM | POA: Diagnosis not present

## 2019-09-04 ENCOUNTER — Encounter: Payer: Self-pay | Admitting: Family Medicine

## 2019-09-06 DIAGNOSIS — Z20822 Contact with and (suspected) exposure to covid-19: Secondary | ICD-10-CM | POA: Diagnosis not present

## 2019-09-12 DIAGNOSIS — R102 Pelvic and perineal pain: Secondary | ICD-10-CM | POA: Diagnosis not present

## 2019-11-21 DIAGNOSIS — H5213 Myopia, bilateral: Secondary | ICD-10-CM | POA: Diagnosis not present

## 2019-12-31 DIAGNOSIS — H5213 Myopia, bilateral: Secondary | ICD-10-CM | POA: Diagnosis not present

## 2020-02-24 DIAGNOSIS — Z3169 Encounter for other general counseling and advice on procreation: Secondary | ICD-10-CM | POA: Diagnosis not present

## 2020-02-24 DIAGNOSIS — Z319 Encounter for procreative management, unspecified: Secondary | ICD-10-CM | POA: Diagnosis not present

## 2020-02-24 DIAGNOSIS — N839 Noninflammatory disorder of ovary, fallopian tube and broad ligament, unspecified: Secondary | ICD-10-CM | POA: Diagnosis not present

## 2020-07-27 DIAGNOSIS — R102 Pelvic and perineal pain: Secondary | ICD-10-CM

## 2020-07-27 HISTORY — DX: Pelvic and perineal pain: R10.2

## 2020-07-27 NOTE — Patient Instructions (Addendum)
Dr. Leafy Ro  Preventive Care 60-30 Years Old, Female Preventive care refers to lifestyle choices and visits with your health care provider that can promote health and wellness. This includes: A yearly physical exam. This is also called an annual wellness visit. Regular dental and eye exams. Immunizations. Screening for certain conditions. Healthy lifestyle choices, such as: Eating a healthy diet. Getting regular exercise. Not using drugs or products that contain nicotine and tobacco. Limiting alcohol use. What can I expect for my preventive care visit? Physical exam Your health care provider may check your: Height and weight. These may be used to calculate your BMI (body mass index). BMI is a measurement that tells if you are at a healthy weight. Heart rate and blood pressure. Body temperature. Skin for abnormal spots. Counseling Your health care provider may ask you questions about your: Past medical problems. Family's medical history. Alcohol, tobacco, and drug use. Emotional well-being. Home life and relationship well-being. Sexual activity. Diet, exercise, and sleep habits. Work and work Statistician. Access to firearms. Method of birth control. Menstrual cycle. Pregnancy history. What immunizations do I need?  Vaccines are usually given at various ages, according to a schedule. Your health care provider will recommend vaccines for you based on your age, medicalhistory, and lifestyle or other factors, such as travel or where you work. What tests do I need?  Blood tests Lipid and cholesterol levels. These may be checked every 5 years starting at age 30. Hepatitis C test. Hepatitis B test. Screening Diabetes screening. This is done by checking your blood sugar (glucose) after you have not eaten for a while (fasting). STD (sexually transmitted disease) testing, if you are at risk. BRCA-related cancer screening. This may be done if you have a family history of breast,  ovarian, tubal, or peritoneal cancers. Pelvic exam and Pap test. This may be done every 3 years starting at age 30. Starting at age 10, this may be done every 5 years if you have a Pap test in combination with an HPV test. Talk with your health care provider about your test results, treatment options,and if necessary, the need for more tests. Follow these instructions at home: Eating and drinking  Eat a healthy diet that includes fresh fruits and vegetables, whole grains, lean protein, and low-fat dairy products. Take vitamin and mineral supplements as recommended by your health care provider. Do not drink alcohol if: Your health care provider tells you not to drink. You are pregnant, may be pregnant, or are planning to become pregnant. If you drink alcohol: Limit how much you have to 0-1 drink a day. Be aware of how much alcohol is in your drink. In the [place] one drink equals one 12 oz bottle of beer (355 mL), one 5 oz glass of wine (148 mL), or one 1 oz glass of hard liquor (44 mL).  Lifestyle Take daily care of your teeth and gums. Brush your teeth every morning and night with fluoride toothpaste. Floss one time each day. Stay active. Exercise for at least 30 minutes 5 or more days each week. Do not use any products that contain nicotine or tobacco, such as cigarettes, e-cigarettes, and chewing tobacco. If you need help quitting, ask your health care provider. Do not use drugs. If you are sexually active, practice safe sex. Use a condom or other form of protection to prevent STIs (sexually transmitted infections). If you do not wish to become pregnant, use a form of birth control. If you plan to become pregnant, see  your health care provider for a prepregnancy visit. Find healthy ways to cope with stress, such as: Meditation, yoga, or listening to music. Journaling. Talking to a trusted person. Spending time with friends and family. Safety Always wear your seat belt while driving or  riding in a vehicle. Do not drive: If you have been drinking alcohol. Do not ride with someone who has been drinking. When you are tired or distracted. While texting. Wear a helmet and other protective equipment during sports activities. If you have firearms in your house, make sure you follow all gun safety procedures. Seek help if you have been physically or sexually abused. What's next? Go to your health care provider once a year for an annual wellness visit. Ask your health care provider how often you should have your eyes and teeth checked. Stay up to date on all vaccines. This information is not intended to replace advice given to you by your health care provider. Make sure you discuss any questions you have with your healthcare provider. Document Revised: 09/15/2019 Document Reviewed: 09/28/2017 Elsevier Patient Education  2022 [employer].

## 2020-07-27 NOTE — Progress Notes (Signed)
Name: Suzanne Cross   MRN: [id number]    DOB: 06/26/1990   Date:07/28/2020       Progress Note  Subjective  Chief Complaint  Annual Exam  HPI  Patient presents for annual CPE.  Diet: discussed a healthy diet  Exercise: discussed 150 minutes weekly   Winnie Visit from 06/12/2019 in [facility]  AUDIT-C Score 0      Depression: Phq 9 is  negative Depression screen [facility] 2/9 07/28/2020 06/12/2019 03/02/2018 09/06/2017  Decreased Interest 0 0 0 0  Down, Depressed, Hopeless 0 0 0 0  PHQ - 2 Score 0 0 0 0  Altered sleeping - 0 - 1  Tired, decreased energy - 0 - 0  Change in appetite - 0 - 0  Feeling bad or failure about yourself  - 0 - 0  Trouble concentrating - 0 - 0  Moving slowly or fidgety/restless - 0 - 0  Suicidal thoughts - 0 - 0  PHQ-9 Score - 0 - 1  Difficult doing work/chores - Not difficult at all - Not difficult at all   Hypertension: BP Readings from Last 3 Encounters:  07/28/20 124/70  06/12/19 112/72  07/08/18 100/65   Obesity: Wt Readings from Last 3 Encounters:  07/28/20 187 lb (84.8 kg)  06/12/19 177 lb 12.8 oz (80.6 kg)  07/06/18 186 lb (84.4 kg)   BMI Readings from Last 3 Encounters:  07/28/20 34.20 kg/m  06/12/19 32.52 kg/m  07/06/18 34.02 kg/m     Vaccines:   Pneumonia: educated and discussed with patient. Flu: educated and discussed with patient.  Hep C Screening: Ordered today STD testing and prevention (HIV/chl/gon/syphilis): 07/06/18 Intimate partner violence:negative Sexual History : married, same sexual partner, she has a history of endometriosis and pelvic pain  Menstrual History/LMP/Abnormal Bleeding: she has monthly cycles but irregular, about once a month , she has been taking Femara but needs to find another OBGYN  Incontinence Symptoms: no problems   Breast cancer:  - Last Mammogram: N/A - BRCA gene screening: N/A  Osteoporosis: Discussed high calcium and vitamin D supplementation, weight  bearing exercises  Cervical cancer screening: 08/06/18  Skin cancer: Discussed monitoring for atypical lesions  Colorectal cancer: N/A      Advanced Care Planning: A voluntary discussion about advance care planning including the explanation and discussion of advance directives.  Discussed health care proxy and Living will, and the patient was able to identify a health care proxy as husband   Lipids: Lab Results  Component Value Date   CHOL 168 06/12/2019   CHOL 184 09/06/2017   Lab Results  Component Value Date   HDL 56 06/12/2019   HDL 70 09/06/2017   Lab Results  Component Value Date   LDLCALC 97 06/12/2019   LDLCALC 98 09/06/2017   Lab Results  Component Value Date   TRIG 65 06/12/2019   TRIG 74 09/06/2017   Lab Results  Component Value Date   CHOLHDL 3.0 06/12/2019   CHOLHDL 2.6 09/06/2017   No results found for: LDLDIRECT  Glucose: Glucose, Bld  Date Value Ref Range Status  06/12/2019 97 65 - 99 mg/dL Final    Comment:    .            Fasting reference interval .   03/02/2018 78 65 - 99 mg/dL Final    Comment:    .            Fasting reference interval .   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    Past Surgical History:  Procedure Laterality Date   CESAREAN SECTION N/A 07/06/2018   Procedure: CESAREAN SECTION;  Surgeon: Ward, Honor Loh, MD;  Location: [facility] ORS;  Service: Obstetrics;   Laterality: N/A;   DIAGNOSTIC LAPAROSCOPY WITH REMOVAL OF ECTOPIC PREGNANCY Left 06/02/2017   Procedure: DIAGNOSTIC LAPAROSCOPY WITH REMOVAL OF ECTOPIC PREGNANCY;  Surgeon: Ward, Honor Loh, MD;  Location: [facility] ORS;  Service: Gynecology;  Laterality: Left;   ECTOPIC PREGNANCY SURGERY  02/2016   LAPAROTOMY N/A 06/02/2017   Procedure: EXPLORATORY LAPAROTOMY;  Surgeon: Ward, Honor Loh, MD;  Location: [facility] ORS;  Service: Gynecology;  Laterality: N/A;   LAPAROTOMY Left 09/2015   LAPAROTOMY Left 08/2016    Family History  Problem Relation Age of Onset   Hypertension Mother    Arthritis Mother    Varicose Veins Mother    Mental retardation Sister    Depression Brother    Clotting disorder Maternal Grandmother    Pulmonary embolism Maternal Grandmother    Hypertension Maternal Grandmother    Varicose Veins Maternal Grandmother    Obesity Maternal Grandmother    Varicose Veins Sister     Social History   Socioeconomic History   Marital status: Married    Spouse name: Chris-Engaged   Number of children: 0   Years of education: Not on file   Highest education level: Some college, no degree  Occupational History   Occupation: Ship broker    Comment: online school for business management   Tobacco Use   Smoking status: Never   Smokeless tobacco: Never  Vaping Use   Vaping Use: Never used  Substance and Sexual Activity   Alcohol use: Not Currently   Drug use: Never   Sexual activity: Yes    Partners: Male    Birth control/protection: None  Other Topics Concern   Not on file  Social History Narrative   Parents divorced when she was a child, they used to live in [place] and mother moved them to [place] after divorce when she was 30 yo. Grew up in [place] and moved to [facility] after her own divorced Feb 2019   Remarried October 2021    Social Determinants of Health   Financial Resource Strain: Low Risk    Difficulty of Paying Living Expenses: Not hard at all  Food Insecurity: No Food Insecurity   Worried  About Charity fundraiser in the Last Year: Never true   Arboriculturist in the Last Year: Never true  Transportation Needs: No Transportation Needs   Lack of Transportation (Medical): No   Lack of Transportation (Non-Medical): No  Physical Activity: Insufficiently Active   Days of Exercise per Week: 5 days   Minutes of Exercise per Session: 20 min  Stress: No Stress Concern Present   Feeling of Stress : Only a little  Social Connections: Moderately Isolated   Frequency of Communication with Friends and Family:  Once a week   Frequency of Social Gatherings with Friends and Family: More than three times a week   Attends Religious Services: Never   Marine scientist or Organizations: No   Attends Archivist Meetings: Never   Marital Status: Married  Human resources officer Violence: Not At Risk   Fear of Current or Ex-Partner: No   Emotionally Abused: No   Physically Abused: No   Sexually Abused: No    No current outpatient medications on file.  Allergies  Allergen Reactions   Propofol     Hypotension     ROS  Constitutional: Negative for fever , positive for  weight change.  Respiratory: Negative for cough and shortness of breath.   Cardiovascular: Negative for chest pain or palpitations.  Gastrointestinal: Negative for abdominal pain, no bowel changes. She has GERD  Musculoskeletal: Negative for gait problem or joint swelling.  Skin: Negative for rash.  Neurological: Negative for dizziness or headache.  No other specific complaints in a complete review of systems (except as listed in HPI above).    Objective  Vitals:   07/28/20 1328  BP: 124/70  Pulse: 86  Resp: 16  Temp: 99.7 F (37.6 C)  TempSrc: Oral  SpO2: 97%  Weight: 187 lb (84.8 kg)  Height: 5' 2" (1.575 m)    Body mass index is 34.2 kg/m.  Physical Exam  Constitutional: Patient appears well-developed and well-nourished. No distress.  HENT: Head: Normocephalic and atraumatic. Ears: B TMs  ok, no erythema or effusion; Nose: Nose normal. Mouth/Throat: not done  Eyes: Conjunctivae and EOM are normal. Pupils are equal, round, and reactive to light. No scleral icterus.  Neck: Normal range of motion. Neck supple. No JVD present. No thyromegaly present.  Cardiovascular: Normal rate, regular rhythm and normal heart sounds.  No murmur heard. No BLE edema. Pulmonary/Chest: Effort normal and breath sounds normal. No respiratory distress. Abdominal: Soft. Bowel sounds are normal, no distension. There is no tenderness. no masses Breast: no lumps or masses, no nipple discharge or rashes FEMALE GENITALIA:  Not done  RECTAL: not done  Musculoskeletal: Normal range of motion, no joint effusions. No gross deformities Neurological: he is alert and oriented to person, place, and time. No cranial nerve deficit. Coordination, balance, strength, speech and gait are normal.  Skin: Skin is warm and dry. No rash noted. No erythema.  Psychiatric: Patient has a normal mood and affect. behavior is normal. Judgment and thought content normal.   Fall Risk: Fall Risk  07/28/2020 06/12/2019 03/02/2018 01/11/2018 11/02/2017  Falls in the past year? 0 0 0 0 No  Number falls in past yr: 0 0 - - -  Injury with Fall? 0 0 - - -  Follow up - Falls evaluation completed - - -     Functional Status Survey: Is the patient deaf or have difficulty hearing?: No Does the patient have difficulty seeing, even when wearing glasses/contacts?: No Does the patient have difficulty concentrating, remembering, or making decisions?: No Does the patient have difficulty walking or climbing stairs?: No Does the patient have difficulty dressing or bathing?: No Does the patient have difficulty doing errands alone such as visiting a doctor's office or shopping?: No   Assessment & Plan  1. Well adult exam  2. Need for vaccination for pneumococcus  - Pneumococcal conjugate vaccine 20-valent (Prevnar 20)   -USPSTF grade A and B  recommendations reviewed with patient; age-appropriate recommendations, preventive care, screening tests, etc discussed and encouraged; healthy living  encouraged; see AVS for patient education given to patient -Discussed importance of 150 minutes of physical activity weekly, eat two servings of fish weekly, eat one serving of tree nuts ( cashews, pistachios, pecans, almonds.Marland Kitchen) every other day, eat 6 servings of fruit/vegetables daily and drink plenty of water and avoid sweet beverages.

## 2020-07-28 ENCOUNTER — Encounter: Payer: Self-pay | Admitting: Family Medicine

## 2020-07-28 ENCOUNTER — Ambulatory Visit (INDEPENDENT_AMBULATORY_CARE_PROVIDER_SITE_OTHER): Payer: Medicaid Other | Admitting: Family Medicine

## 2020-07-28 ENCOUNTER — Other Ambulatory Visit: Payer: Self-pay

## 2020-07-28 VITALS — BP 124/70 | HR 86 | Temp 99.7°F | Resp 16 | Ht 62.0 in | Wt 187.0 lb

## 2020-07-28 DIAGNOSIS — Z0000 Encounter for general adult medical examination without abnormal findings: Secondary | ICD-10-CM | POA: Diagnosis not present

## 2020-07-28 DIAGNOSIS — Z23 Encounter for immunization: Secondary | ICD-10-CM

## 2020-07-28 DIAGNOSIS — Z1159 Encounter for screening for other viral diseases: Secondary | ICD-10-CM | POA: Diagnosis not present

## 2020-08-05 ENCOUNTER — Other Ambulatory Visit: Payer: Self-pay

## 2020-08-05 ENCOUNTER — Encounter: Payer: Self-pay | Admitting: Family Medicine

## 2020-08-05 ENCOUNTER — Ambulatory Visit: Payer: Medicaid Other | Admitting: Family Medicine

## 2020-08-05 VITALS — BP 118/72 | HR 98 | Temp 99.1°F | Resp 16 | Ht 62.0 in | Wt 188.1 lb

## 2020-08-05 DIAGNOSIS — K219 Gastro-esophageal reflux disease without esophagitis: Secondary | ICD-10-CM | POA: Diagnosis not present

## 2020-08-05 MED ORDER — FAMOTIDINE 20 MG PO TABS
20.0000 mg | ORAL_TABLET | Freq: Two times a day (BID) | ORAL | 1 refills | Status: DC
Start: 2020-08-05 — End: 2021-01-27

## 2020-08-05 NOTE — Assessment & Plan Note (Signed)
Refractory to PPI. Will trial H2 blockers again. Will refer to GI given early satiety, occasional regurgitation to evaluate for potential repeat endoscopy. Emergency precautions reviewed.

## 2020-08-05 NOTE — Progress Notes (Signed)
   SUBJECTIVE:   CHIEF COMPLAINT / HPI:   GERD - Meds: none - wants referral to GI. - Symptoms:  abdominal bloating, cough, difficulty swallowing, early satiety, heartburn, nausea, and symptoms primarily relate to meals, and lying down after meals.  - has not lost weight. denies melena, hematochezia, hematemesis, and coffee ground emesis.  - Previous treatment: antacids, H2 antagonists, proton pump inhibitors, and simethicone , gaviscon. Reports worsened symptoms with PPI. - had a prior endoscopy with inflammation ~2018 per patient. - prior H pylori test negative 2019 - no FH of GI malignancies - does not smoke, drink alcohol - coffee occasionally.  - has had to focus on bland diet.  - symptoms worse recently, difficult to eat.    OBJECTIVE:   BP 118/72   Pulse 98   Temp 99.1 F (37.3 C) (Oral)   Resp 16   Ht 5\' 2"  (1.575 m)   Wt 188 lb 1.6 oz (85.3 kg)   SpO2 98%   BMI 34.40 kg/m   Gen: overweight, in NAD Abd: +BS, soft, nondistended. Slightly TTP in upper quadrants. No organomegaly appreciable.   ASSESSMENT/PLAN:   GERD (gastroesophageal reflux disease) Refractory to PPI. Will trial H2 blockers again. Will refer to GI given early satiety, occasional regurgitation to evaluate for potential repeat endoscopy. Emergency precautions reviewed.     Myles Gip, DO

## 2020-08-10 ENCOUNTER — Encounter: Payer: Self-pay | Admitting: *Deleted

## 2020-10-13 ENCOUNTER — Other Ambulatory Visit: Payer: Self-pay

## 2020-10-13 ENCOUNTER — Ambulatory Visit (INDEPENDENT_AMBULATORY_CARE_PROVIDER_SITE_OTHER): Payer: Medicaid Other | Admitting: Gastroenterology

## 2020-10-13 ENCOUNTER — Encounter: Payer: Self-pay | Admitting: Gastroenterology

## 2020-10-13 VITALS — BP 109/72 | HR 91 | Temp 98.3°F | Ht 62.0 in | Wt 188.0 lb

## 2020-10-13 DIAGNOSIS — K6289 Other specified diseases of anus and rectum: Secondary | ICD-10-CM | POA: Diagnosis not present

## 2020-10-13 DIAGNOSIS — K625 Hemorrhage of anus and rectum: Secondary | ICD-10-CM

## 2020-10-13 DIAGNOSIS — K219 Gastro-esophageal reflux disease without esophagitis: Secondary | ICD-10-CM

## 2020-10-13 MED ORDER — DEXLANSOPRAZOLE 60 MG PO CPDR: 60.0000 mg | DELAYED_RELEASE_CAPSULE | Freq: Every day | ORAL | 0 refills | Status: DC

## 2020-10-13 MED ORDER — PEG 3350-KCL-NA BICARB-NACL 420 G PO SOLR: ORAL | 0 refills | Status: DC

## 2020-10-13 NOTE — Patient Instructions (Signed)
Gastroesophageal Reflux Disease, Adult Gastroesophageal reflux (GER) happens when acid from the stomach flows up into the tube that connects the mouth and the stomach (esophagus). Normally, food travels down the esophagus and stays in the stomach to be digested. With GER, food and stomach acid sometimes move back up into the esophagus. You may have a disease called gastroesophageal reflux disease (GERD) if the reflux: Happens often. Causes frequent or very bad symptoms. Causes problems such as damage to the esophagus. When this happens, the esophagus becomes sore and swollen. Over time, GERD can make small holes (ulcers) in the lining of the esophagus. What are the causes? This condition is caused by a problem with the muscle between the esophagus and the stomach. When this muscle is weak or not normal, it does not close properly to keep food and acid from coming back up from the stomach. The muscle can be weak because of: Tobacco use. Pregnancy. Having a certain type of hernia (hiatal hernia). Alcohol use. Certain foods and drinks, such as coffee, chocolate, onions, and peppermint. What increases the risk? Being overweight. Having a disease that affects your connective tissue. Taking NSAIDs, such a ibuprofen. What are the signs or symptoms? Heartburn. Difficult or painful swallowing. The feeling of having a lump in the throat. A bitter taste in the mouth. Bad breath. Having a lot of saliva. Having an upset or bloated stomach. Burping. Chest pain. Different conditions can cause chest pain. Make sure you see your doctor if you have chest pain. Shortness of breath or wheezing. A long-term cough or a cough at night. Wearing away of the surface of teeth (tooth enamel). Weight loss. How is this treated? Making changes to your diet. Taking medicine. Having surgery. Treatment will depend on how bad your symptoms are. Follow these instructions at home: Eating and drinking  Follow a  diet as told by your doctor. You may need to avoid foods and drinks such as: Coffee and tea, with or without caffeine. Drinks that contain alcohol. Energy drinks and sports drinks. Bubbly (carbonated) drinks or sodas. Chocolate and cocoa. Peppermint and mint flavorings. Garlic and onions. Horseradish. Spicy and acidic foods. These include peppers, chili powder, curry powder, vinegar, hot sauces, and BBQ sauce. Citrus fruit juices and citrus fruits, such as oranges, lemons, and limes. Tomato-based foods. These include red sauce, chili, salsa, and pizza with red sauce. Fried and fatty foods. These include donuts, french fries, potato chips, and high-fat dressings. High-fat meats. These include hot dogs, rib eye steak, sausage, ham, and bacon. High-fat dairy items, such as whole milk, butter, and cream cheese. Eat small meals often. Avoid eating large meals. Avoid drinking large amounts of liquid with your meals. Avoid eating meals during the 2-3 hours before bedtime. Avoid lying down right after you eat. Do not exercise right after you eat. Lifestyle  Do not smoke or use any products that contain nicotine or tobacco. If you need help quitting, ask your doctor. Try to lower your stress. If you need help doing this, ask your doctor. If you are overweight, lose an amount of weight that is healthy for you. Ask your doctor about a safe weight loss goal. General instructions Pay attention to any changes in your symptoms. Take over-the-counter and prescription medicines only as told by your doctor. Do not take aspirin, ibuprofen, or other NSAIDs unless your doctor says it is okay. Wear loose clothes. Do not wear anything tight around your waist. Raise (elevate) the head of your bed about 6  inches (15 cm). You may need to use a wedge to do this. Avoid bending over if this makes your symptoms worse. Keep all follow-up visits. Contact a doctor if: You have new symptoms. You lose weight and you  do not know why. You have trouble swallowing or it hurts to swallow. You have wheezing or a cough that keeps happening. You have a hoarse voice. Your symptoms do not get better with treatment. Get help right away if: You have sudden pain in your arms, neck, jaw, teeth, or back. You suddenly feel sweaty, dizzy, or light-headed. You have chest pain or shortness of breath. You vomit and the vomit is green, yellow, or black, or it looks like blood or coffee grounds. You faint. Your poop (stool) is red, bloody, or black. You cannot swallow, drink, or eat. These symptoms may represent a serious problem that is an emergency. Do not wait to see if the symptoms will go away. Get medical help right away. Call your local emergency services (911 in the [place] Do not drive yourself to the hospital. Summary If a person has gastroesophageal reflux disease (GERD), food and stomach acid move back up into the esophagus and cause symptoms or problems such as damage to the esophagus. Treatment will depend on how bad your symptoms are. Follow a diet as told by your doctor. Take all medicines only as told by your doctor. This information is not intended to replace advice given to you by your health care provider. Make sure you discuss any questions you have with your health care provider. Document Revised: 07/29/2019 Document Reviewed: 07/29/2019 Elsevier Patient Education  [place].

## 2020-10-13 NOTE — Progress Notes (Signed)
Suzanne Bellows MD, MRCP(U.K) [address]  [place]  [place], [place] [address]  Main: [phone]  Fax: [phone]   Gastroenterology Consultation  Referring Provider:     Steele Sizer, MD Primary Care Physician:  Suzanne Sizer, MD Primary Gastroenterologist:  Dr. Jonathon Cross  Reason for Consultation:   GERD        HPI:   Suzanne Cross is a 30 y.o. y/o female referred for consultation & management  by Dr. Ancil Cross, Suzanne Stager, MD.    She states that she started having acid reflux about 5 years back.  Wakes up in the middle of the night with heartburn and often in the morning with chest discomfort.  Has tried multiple OTC meds including Prilosec at high dose which has not worked.  She has gained weight over the years.  Some issues with dysphagia at times.  Some odynophagia as well.  No prior endoscopy.  She also complains of pain during defecation.  Denies any constipation.  Feels like a knife cutting through her anus.  She has also noticed some blood on the toilet bowl and tissue paper in the past.  No known family history of colon cancer.  No prior colonoscopy.    Past Medical History:  Diagnosis Date   Acid reflux disease    Anemia    Chocolate cyst of ovary    Left   Complication of anesthesia    LOW bp   Ectopic pregnancy 2019, 2018   Endometriosis    Scoliosis     Past Surgical History:  Procedure Laterality Date   CESAREAN SECTION N/A 07/06/2018   Procedure: CESAREAN SECTION;  Surgeon: Ward, Honor Loh, MD;  Location: [facility] ORS;  Service: Obstetrics;  Laterality: N/A;   DIAGNOSTIC LAPAROSCOPY WITH REMOVAL OF ECTOPIC PREGNANCY Left 06/02/2017   Procedure: DIAGNOSTIC LAPAROSCOPY WITH REMOVAL OF ECTOPIC PREGNANCY;  Surgeon: Ward, Honor Loh, MD;  Location: [facility] ORS;  Service: Gynecology;  Laterality: Left;   ECTOPIC PREGNANCY SURGERY  02/2016   LAPAROTOMY N/A 06/02/2017   Procedure: EXPLORATORY LAPAROTOMY;  Surgeon: Ward, Honor Loh, MD;  Location: [facility] ORS;  Service:  Gynecology;  Laterality: N/A;   LAPAROTOMY Left 09/2015   LAPAROTOMY Left 08/2016    Prior to Admission medications   Medication Sig Start Date End Date Taking? Authorizing Provider  famotidine (PEPCID) 20 MG tablet Take 1 tablet (20 mg total) by mouth 2 (two) times daily. 08/05/20   Myles Gip, DO  letrozole [facility]) 2.5 MG tablet  01/29/20   [provider]  progesterone (PROMETRIUM) 200 MG capsule Take 1 capsule by mouth daily. 09/10/20   [provider]    Family History  Problem Relation Age of Onset   Hypertension Mother    Arthritis Mother    Varicose Veins Mother    Mental retardation Sister    Depression Brother    Clotting disorder Maternal Grandmother    Pulmonary embolism Maternal Grandmother    Hypertension Maternal Grandmother    Varicose Veins Maternal Grandmother    Obesity Maternal Grandmother    Varicose Veins Sister      Social History   Tobacco Use   Smoking status: Never   Smokeless tobacco: Never  Vaping Use   Vaping Use: Never used  Substance Use Topics   Alcohol use: Not Currently   Drug use: Never    Allergies as of 10/13/2020 - Review Complete 08/05/2020  Allergen Reaction Noted   Propofol  08/19/2018    Review of  Systems:    All systems reviewed and negative except where noted in HPI.   Physical Exam:  BP 109/72   Pulse 91   Temp 98.3 F (36.8 C) (Oral)   Ht '5\' 2"'$  (1.575 m)   Wt 188 lb (85.3 kg)   BMI 34.39 kg/m  No LMP recorded. Psych:  Alert and cooperative. Normal mood and affect. General:   Alert,  Well-developed, well-nourished, pleasant and cooperative in NAD Head:  Normocephalic and atraumatic. Eyes:  Sclera clear, no icterus.   Conjunctiva pink. Ears:  Normal auditory acuity. Lungs:  Respirations even and unlabored.  Clear throughout to auscultation.   No wheezes, crackles, or rhonchi. No acute distress. Heart:  Regular rate and rhythm; no murmurs, clicks, rubs, or gallops. Abdomen:  Normal bowel  sounds.  No bruits.  Soft, non-tender and non-distended without masses, hepatosplenomegaly or hernias noted.  No guarding or rebound tenderness.    Neurologic:  Alert and oriented x3;  grossly normal neurologically. Psych:  Alert and cooperative. Normal mood and affect.  Imaging Studies: No results found.  Assessment and Plan:   Suzanne Cross is a 30 y.o. y/o female has been referred for GERD which has not responded to OTC meds including high-dose omeprazole.  She may have underlying large hiatal hernia which may be contributing to the reflux.  She also complains of pain during defecation which may be an anal fissure and she complains of rectal bleeding which needs evaluation.  Plan 1.  Acid reflux counseled about lifestyle changes and we will commence her on Dexilant 60 mg a day as a trial.  Patient information has been provided. 2.  We will perform an EGD next week to evaluate for eosinophilic esophagitis, peptic stricture, hiatal hernia.  If she does have a large hiatal hernia we will discuss about referral for surgery if needed. 3.  Anal pain likely an anal fissure I will examine her with a digital exam during her colonoscopy which I will plan to do next week for the anal pain as well as for rectal bleeding. 4.  High-fiber diet counseled about target of 25 g/day we will give her patient information for the same.  I have discussed alternative options, risks & benefits,  which include, but are not limited to, bleeding, infection, perforation,respiratory complication & drug reaction.  The patient agrees with this plan & written consent will be obtained.     Follow up in 4-6 weeks   Dr Suzanne Bellows MD,MRCP(U.K)

## 2020-10-19 ENCOUNTER — Ambulatory Visit: Payer: Medicaid Other | Admitting: Anesthesiology

## 2020-10-19 ENCOUNTER — Encounter: Payer: Self-pay | Admitting: Gastroenterology

## 2020-10-19 ENCOUNTER — Ambulatory Visit
Admission: RE | Admit: 2020-10-19 | Discharge: 2020-10-19 | Disposition: A | Discharge status: Home / Self Care | Payer: Medicaid Other | Attending: Gastroenterology | Admitting: Gastroenterology

## 2020-10-19 ENCOUNTER — Encounter
Admission: RE | Disposition: A | Discharge status: Home / Self Care | Payer: Self-pay | Source: Home / Self Care | Attending: Gastroenterology

## 2020-10-19 DIAGNOSIS — K635 Polyp of colon: Secondary | ICD-10-CM | POA: Diagnosis not present

## 2020-10-19 DIAGNOSIS — Z79811 Long term (current) use of aromatase inhibitors: Secondary | ICD-10-CM | POA: Diagnosis not present

## 2020-10-19 DIAGNOSIS — Z79899 Other long term (current) drug therapy: Secondary | ICD-10-CM | POA: Insufficient documentation

## 2020-10-19 DIAGNOSIS — D122 Benign neoplasm of ascending colon: Secondary | ICD-10-CM | POA: Insufficient documentation

## 2020-10-19 DIAGNOSIS — K640 First degree hemorrhoids: Secondary | ICD-10-CM | POA: Diagnosis not present

## 2020-10-19 DIAGNOSIS — K219 Gastro-esophageal reflux disease without esophagitis: Secondary | ICD-10-CM

## 2020-10-19 DIAGNOSIS — K625 Hemorrhage of anus and rectum: Secondary | ICD-10-CM

## 2020-10-19 DIAGNOSIS — Z884 Allergy status to anesthetic agent status: Secondary | ICD-10-CM | POA: Insufficient documentation

## 2020-10-19 HISTORY — PX: ESOPHAGOGASTRODUODENOSCOPY: SHX5428

## 2020-10-19 HISTORY — PX: COLONOSCOPY WITH PROPOFOL: SHX5780

## 2020-10-19 LAB — POCT PREGNANCY, URINE: Preg Test, Ur: NEGATIVE

## 2020-10-19 SURGERY — EGD (ESOPHAGOGASTRODUODENOSCOPY)
Anesthesia: General

## 2020-10-19 MED ORDER — PROPOFOL 500 MG/50ML IV EMUL
INTRAVENOUS | Status: DC | PRN
  Administered 2020-10-19: 200 ug/kg/min via INTRAVENOUS

## 2020-10-19 MED ORDER — PROPOFOL 500 MG/50ML IV EMUL
INTRAVENOUS | Status: AC
  Filled 2020-10-19: qty 50

## 2020-10-19 MED ORDER — SODIUM CHLORIDE 0.9 % IV SOLN
INTRAVENOUS | Status: DC
  Administered 2020-10-19: 20 mL/h via INTRAVENOUS

## 2020-10-19 NOTE — Op Note (Signed)
[facility] Gastroenterology Patient Name: Suzanne Cross Procedure Date: 10/19/2020 9:18 AM MRN: [id number] Account #: [id number] Date of Birth: April 02, 1990 Admit Type: Outpatient Age: 30 Room: [facility] ENDO ROOM 4 Gender: Female Note Status: Finalized Instrument Name: Upper Endoscope [id number] Procedure:             Upper GI endoscopy Indications:           Follow-up of esophageal reflux Providers:             [name] MD, MD Referring MD:          Bethena Roys. Sowles, MD (Referring MD) Medicines:             Monitored Anesthesia Care Complications:         No immediate complications. Procedure:             Pre-Anesthesia Assessment:                        - Prior to the procedure, a History and Physical was                         performed, and patient medications, allergies and                         sensitivities were reviewed. The patient's tolerance                         of previous anesthesia was reviewed.                        - The risks and benefits of the procedure and the                         sedation options and risks were discussed with the                         patient. All questions were answered and informed                         consent was obtained.                        - ASA Grade Assessment: II - A patient with mild                         systemic disease.                        After obtaining informed consent, the endoscope was                         passed under direct vision. Throughout the procedure,                         the patient's blood pressure, pulse, and oxygen                         saturations were monitored continuously. The Endoscope  was introduced through the mouth, and advanced to the                         third part of duodenum. The upper GI endoscopy was                         accomplished with ease. The patient tolerated the                         procedure well. Findings:       The stomach was normal.      The examined duodenum was normal.      The cardia and gastric fundus were normal on retroflexion.      The examined esophagus was normal. Biopsies were taken with a cold       forceps for histology. Impression:            - Normal stomach.                        - Normal examined duodenum.                        - Normal esophagus. Biopsied. Recommendation:        - Await pathology results.                        - Perform a colonoscopy today. Procedure Code(s):     --- Professional ---                        [phone], Esophagogastroduodenoscopy, flexible,                         transoral; with biopsy, single or multiple Diagnosis Code(s):     --- Professional ---                        K21.9, Gastro-esophageal reflux disease without                         esophagitis CPT copyright 2019 American Medical Association. All rights reserved. The codes documented in this report are preliminary and upon coder review may  be revised to meet current compliance requirements. [name], MD [name] MD, MD 10/19/2020 9:30:45 AM This report has been signed electronically. Number of Addenda: 0 Note Initiated On: 10/19/2020 9:18 AM Estimated Blood Loss:  Estimated blood loss: none.      [facility]

## 2020-10-19 NOTE — Anesthesia Preprocedure Evaluation (Signed)
Anesthesia Evaluation  Patient identified by MRN, date of birth, ID band Patient awake    Reviewed: Allergy & Precautions, H&P , NPO status , Patient's Chart, lab work & pertinent test results, reviewed documented beta blocker date and time   History of Anesthesia Complications (+) history of anesthetic complications  Airway Mallampati: II   Neck ROM: full    Dental  (+) Poor Dentition   Pulmonary neg pulmonary ROS,    Pulmonary exam normal        Cardiovascular Exercise Tolerance: Good negative cardio ROS Normal cardiovascular exam Rhythm:regular Rate:Normal     Neuro/Psych PSYCHIATRIC DISORDERS Depression negative neurological ROS     GI/Hepatic Neg liver ROS, GERD  Medicated,  Endo/Other  negative endocrine ROS  Renal/GU negative Renal ROS  negative genitourinary   Musculoskeletal   Abdominal   Peds  Hematology  (+) Blood dyscrasia, anemia ,   Anesthesia Other Findings Past Medical History: No date: Acid reflux disease No date: Anemia No date: Chocolate cyst of ovary     Comment:  Left No date: Complication of anesthesia     Comment:  LOW bp 2019, 2018: Ectopic pregnancy No date: Endometriosis No date: Scoliosis Past Surgical History: 07/06/2018: CESAREAN SECTION; N/A     Comment:  Procedure: CESAREAN SECTION;  Surgeon: Maceo Pro,               MD;  Location: [facility] ORS;  Service: Obstetrics;                Laterality: N/A; 06/02/2017: DIAGNOSTIC LAPAROSCOPY WITH REMOVAL OF ECTOPIC PREGNANCY;  Left     Comment:  Procedure: DIAGNOSTIC LAPAROSCOPY WITH REMOVAL OF               ECTOPIC PREGNANCY;  Surgeon: Ward, Honor Loh, MD;                Location: [facility] ORS;  Service: Gynecology;  Laterality:               Left; 02/2016: ECTOPIC PREGNANCY SURGERY 06/02/2017: LAPAROTOMY; N/A     Comment:  Procedure: EXPLORATORY LAPAROTOMY;  Surgeon: Ward,               Honor Loh, MD;  Location: [facility] ORS;  Service:  Gynecology;              Laterality: N/A; 09/2015: LAPAROTOMY; Left 08/2016: LAPAROTOMY; Left BMI    Body Mass Index: 34.39 kg/m     Reproductive/Obstetrics negative OB ROS                             Anesthesia Physical Anesthesia Plan  ASA: 2  Anesthesia Plan: General   Post-op Pain Management:    Induction:   PONV Risk Score and Plan:   Airway Management Planned:   Additional Equipment:   Intra-op Plan:   Post-operative Plan:   Informed Consent: I have reviewed the patients History and Physical, chart, labs and discussed the procedure including the risks, benefits and alternatives for the proposed anesthesia with the patient or authorized representative who has indicated his/her understanding and acceptance.     Dental Advisory Given  Plan Discussed with: CRNA  Anesthesia Plan Comments: (Propofol caused blood (pressure to drop.  Treated without difficulty. ja)        Anesthesia Quick Evaluation

## 2020-10-19 NOTE — Transfer of Care (Signed)
Immediate Anesthesia Transfer of Care Note  Patient: Suzanne Cross  Procedure(s) Performed: ESOPHAGOGASTRODUODENOSCOPY (EGD) COLONOSCOPY WITH PROPOFOL  Patient Location: PACU  Anesthesia Type:General  Level of Consciousness: awake and sedated  Airway & Oxygen Therapy: Patient Spontanous Breathing and Patient connected to nasal cannula oxygen  Post-op Assessment: Report given to RN and Post -op Vital signs reviewed and stable  Post vital signs: Reviewed and stable  Last Vitals:  Vitals Value Taken Time  BP    Temp    Pulse 93 10/19/20 0946  Resp 22 10/19/20 0946  SpO2 96 % 10/19/20 0946  Vitals shown include unvalidated device data.  Last Pain:  Vitals:   10/19/20 0901  TempSrc: Temporal  PainSc: 0-No pain         Complications: No notable events documented.

## 2020-10-19 NOTE — Op Note (Signed)
[facility] Gastroenterology Patient Name: Suzanne Cross Procedure Date: 10/19/2020 9:17 AM MRN: [id number] Account #: [id number] Date of Birth: 1990-11-11 Admit Type: Outpatient Age: 30 Room: [facility] ENDO ROOM 4 Gender: Female Note Status: Finalized Instrument Name: Jasper Riling [id number] Procedure:             Colonoscopy Indications:           Rectal bleeding Providers:             [name] MD, MD Referring MD:          Bethena Roys. Sowles, MD (Referring MD) Medicines:             Monitored Anesthesia Care Complications:         No immediate complications. Procedure:             Pre-Anesthesia Assessment:                        - Prior to the procedure, a History and Physical was                         performed, and patient medications, allergies and                         sensitivities were reviewed. The patient's tolerance                         of previous anesthesia was reviewed.                        - The risks and benefits of the procedure and the                         sedation options and risks were discussed with the                         patient. All questions were answered and informed                         consent was obtained.                        - ASA Grade Assessment: II - A patient with mild                         systemic disease.                        After obtaining informed consent, the colonoscope was                         passed under direct vision. Throughout the procedure,                         the patient's blood pressure, pulse, and oxygen                         saturations were monitored continuously. The                         Colonoscope was introduced through the  anus and                         advanced to the the cecum, identified by the                         appendiceal orifice. The colonoscopy was performed                         with ease. The patient tolerated the procedure well.                          The quality of the bowel preparation was excellent. Findings:      The perianal and digital rectal examinations were normal.      Non-bleeding internal hemorrhoids were found during retroflexion. The       hemorrhoids were small and Grade I (internal hemorrhoids that do not       prolapse).      A 8 mm polyp was found in the ascending colon. The polyp was sessile.       The polyp was removed with a cold snare. Resection and retrieval were       complete.      The exam was otherwise without abnormality on direct and retroflexion       views. Impression:            - Non-bleeding internal hemorrhoids.                        - One 8 mm polyp in the ascending colon, removed with                         a cold snare. Resected and retrieved.                        - The examination was otherwise normal on direct and                         retroflexion views. Recommendation:        - Discharge patient to home (with escort).                        - Resume previous diet.                        - Continue present medications.                        - Await pathology results.                        - Repeat colonoscopy for surveillance based on                         pathology results.                        - Return to GI office as previously scheduled. Procedure Code(s):     --- Professional ---                        [phone],  Colonoscopy, flexible; with removal of                         tumor(s), polyp(s), or other lesion(s) by snare                         technique Diagnosis Code(s):     --- Professional ---                        K63.5, Polyp of colon                        K64.0, First degree hemorrhoids                        K62.5, Hemorrhage of anus and rectum CPT copyright 2019 American Medical Association. All rights reserved. The codes documented in this report are preliminary and upon coder review may  be revised to meet current compliance requirements. [name], MD [name] MD, MD 10/19/2020 9:43:53 AM This report has been signed electronically. Number of Addenda: 0 Note Initiated On: 10/19/2020 9:17 AM Scope Withdrawal Time: 0 hours 8 minutes 27 seconds  Total Procedure Duration: 0 hours 9 minutes 42 seconds  Estimated Blood Loss:  Estimated blood loss: none.      [facility]

## 2020-10-19 NOTE — H&P (Signed)
Suzanne Bellows, MD [address] [place], [place], [place], [address] [address], [place], [place], [place], [address] Phone: [phone]  Fax: [phone]  Primary Care Physician:  Suzanne Sizer, MD   Pre-Procedure History & Physical: HPI:  Suzanne Cross is a 30 y.o. female is here for an endoscopy and colonoscopy    Past Medical History:  Diagnosis Date   Acid reflux disease    Anemia    Chocolate cyst of ovary    Left   Complication of anesthesia    LOW bp   Ectopic pregnancy 2019, 2018   Endometriosis    Scoliosis     Past Surgical History:  Procedure Laterality Date   CESAREAN SECTION N/A 07/06/2018   Procedure: CESAREAN SECTION;  Surgeon: Suzanne Pro, MD;  Location: [facility] ORS;  Service: Obstetrics;  Laterality: N/A;   DIAGNOSTIC LAPAROSCOPY WITH REMOVAL OF ECTOPIC PREGNANCY Left 06/02/2017   Procedure: DIAGNOSTIC LAPAROSCOPY WITH REMOVAL OF ECTOPIC PREGNANCY;  Surgeon: Cross, Suzanne Loh, MD;  Location: [facility] ORS;  Service: Gynecology;  Laterality: Left;   ECTOPIC PREGNANCY SURGERY  02/2016   LAPAROTOMY N/A 06/02/2017   Procedure: EXPLORATORY LAPAROTOMY;  Surgeon: Cross, Suzanne Loh, MD;  Location: [facility] ORS;  Service: Gynecology;  Laterality: N/A;   LAPAROTOMY Left 09/2015   LAPAROTOMY Left 08/2016    Prior to Admission medications   Medication Sig Start Date End Date Taking? Authorizing Provider  dexlansoprazole (DEXILANT) 60 MG capsule Take 1 capsule (60 mg total) by mouth daily. 10/13/20  Yes Suzanne Bellows, MD  famotidine (PEPCID) 20 MG tablet Take 1 tablet (20 mg total) by mouth 2 (two) times daily. 08/05/20  Yes Myles Gip, DO  letrozole [facility]) 2.5 MG tablet  01/29/20  Yes [provider]  polyethylene glycol-electrolytes (NULYTELY) 420 g solution Starting at 5:00 PM: Drink one 8 oz glass of mixture every 15 minutes until you finish half of the jug. Five hours prior to procedure, drink 8 oz glass of mixture every 15 minutes until it is all gone.  Make sure you do not drink anything 4 hours prior to your procedure. 10/13/20  Yes Suzanne Bellows, MD  progesterone (PROMETRIUM) 200 MG capsule Take 1 capsule by mouth daily. 09/10/20  Yes [provider]    Allergies as of 10/13/2020 - Review Complete 08/05/2020  Allergen Reaction Noted   Propofol  08/19/2018    Family History  Problem Relation Age of Onset   Hypertension Mother    Arthritis Mother    Varicose Veins Mother    Mental retardation Sister    Depression Brother    Clotting disorder Maternal Grandmother    Pulmonary embolism Maternal Grandmother    Hypertension Maternal Grandmother    Varicose Veins Maternal Grandmother    Obesity Maternal Grandmother    Varicose Veins Sister     Social History   Socioeconomic History   Marital status: Married    Spouse name: Suzanne Cross   Number of children: 0   Years of education: Not on file   Highest education level: Some college, no degree  Occupational History   Occupation: Ship broker    Comment: online school for business management   Tobacco Use   Smoking status: Never   Smokeless tobacco: Never  Vaping Use   Vaping Use: Never used  Substance and Sexual Activity   Alcohol use: Not Currently   Drug use: Never   Sexual activity: Yes    Partners: Male    Birth control/protection:  None  Other Topics Concern   Not on file  Social History Narrative   Parents divorced when she was a child, they used to live in [place] and mother moved them to [place] after divorce when she was 30 yo. Grew up in [place] and moved to [facility] after her own divorced Feb 2019   Remarried October 2021    Social Determinants of Health   Financial Resource Strain: Low Risk    Difficulty of Paying Living Expenses: Not hard at all  Food Insecurity: No Food Insecurity   Worried About Charity fundraiser in the Last Year: Never true   Arboriculturist in the Last Year: Never true  Transportation Needs: No Transportation Needs   Lack of Transportation  (Medical): No   Lack of Transportation (Non-Medical): No  Physical Activity: Insufficiently Active   Days of Exercise per Week: 5 days   Minutes of Exercise per Session: 20 min  Stress: No Stress Concern Present   Feeling of Stress : Only a little  Social Connections: Moderately Isolated   Frequency of Communication with Friends and Family: Once a week   Frequency of Social Gatherings with Friends and Family: More than three times a week   Attends Religious Services: Never   Marine scientist or Organizations: No   Attends Music therapist: Never   Marital Status: Married  Human resources officer Violence: Not At Risk   Fear of Current or Ex-Partner: No   Emotionally Abused: No   Physically Abused: No   Sexually Abused: No    Review of Systems: See HPI, otherwise negative ROS  Physical Exam: BP (!) 127/96   Pulse 92   Temp (!) 96.3 F (35.7 C) (Temporal)   Resp 20   Ht '5\' 2"'$  (1.575 m)   Wt 85.3 kg   SpO2 100%   BMI 34.39 kg/m  General:   Alert,  pleasant and cooperative in NAD Head:  Normocephalic and atraumatic. Neck:  Supple; no masses or thyromegaly. Lungs:  Clear throughout to auscultation, normal respiratory effort.    Heart:  +S1, +S2, Regular rate and rhythm, No edema. Abdomen:  Soft, nontender and nondistended. Normal bowel sounds, without guarding, and without rebound.   Neurologic:  Alert and  oriented x4;  grossly normal neurologically.  Impression/Plan: Suzanne Cross is here for an endoscopy and colonoscopy  to be performed for  evaluation of gerd and rectal bleeding    Risks, benefits, limitations, and alternatives regarding endoscopy have been reviewed with the patient.  Questions have been answered.  All parties agreeable.   Suzanne Bellows, MD  10/19/2020, 9:11 AM

## 2020-10-19 NOTE — Anesthesia Procedure Notes (Signed)
Date/Time: 10/19/2020 9:21 AM Performed by: Vaughan Sine Pre-anesthesia Checklist: Patient identified, Emergency Drugs available, Suction available, Patient being monitored and Timeout performed Patient Re-evaluated:Patient Re-evaluated prior to induction Oxygen Delivery Method: Nasal cannula Preoxygenation: Pre-oxygenation with 100% oxygen Induction Type: IV induction Airway Equipment and Method: Bite block Placement Confirmation: positive ETCO2 and CO2 detector

## 2020-10-19 NOTE — Anesthesia Postprocedure Evaluation (Signed)
Anesthesia Post Note  Patient: Suzanne Cross  Procedure(s) Performed: ESOPHAGOGASTRODUODENOSCOPY (EGD) COLONOSCOPY WITH PROPOFOL  Patient location during evaluation: PACU Anesthesia Type: General Level of consciousness: awake and alert Pain management: pain level controlled Vital Signs Assessment: post-procedure vital signs reviewed and stable Respiratory status: spontaneous breathing, nonlabored ventilation, respiratory function stable and patient connected to nasal cannula oxygen Cardiovascular status: blood pressure returned to baseline and stable Postop Assessment: no apparent nausea or vomiting Anesthetic complications: no   No notable events documented.   Last Vitals:  Vitals:   10/19/20 0950 10/19/20 1000  BP: 120/71 113/62  Pulse: 87 76  Resp: 18 18  Temp:    SpO2: 97% 100%    Last Pain:  Vitals:   10/19/20 0940  TempSrc: Temporal  PainSc:                  [name]

## 2020-10-20 ENCOUNTER — Encounter: Payer: Self-pay | Admitting: Gastroenterology

## 2020-10-21 LAB — SURGICAL PATHOLOGY

## 2020-11-01 ENCOUNTER — Encounter: Payer: Self-pay | Admitting: Gastroenterology

## 2020-11-07 ENCOUNTER — Other Ambulatory Visit: Payer: Self-pay | Admitting: Gastroenterology

## 2020-11-23 ENCOUNTER — Other Ambulatory Visit: Payer: Self-pay | Admitting: Gastroenterology

## 2020-11-25 IMAGING — US US MFM OB COMPLETE +14 WKS
1 series · 13 of 28 positions shown · non-contrast
Comparison: none

PATIENT INFO:

PERFORMED BY:
                   Sonographer
                   MUKHAMMAD
SERVICE(S) PROVIDED:
  US MFM OB COMP LESS THAN 14 WEEKS                    76801.4
 ----------------------------------------------------------------------
INDICATIONS:
  12 weeks gestation of pregnancy
  First trimester screen
FETAL EVALUATION:
 Num Of Fetuses:         1
 Fetal Heart Rate(bpm):  158
 Cardiac Activity:       Present
 Presentation:           Variable
 Placenta:               Posterior
 P. Cord Insertion:      Normal
BIOMETRY:
 CRL:      62.4  mm     G. Age:  12w 4d                  EDD:   07/22/18
GESTATIONAL AGE:
 LMP:           11w 5d        Date:  10/21/17                 EDD:   07/28/18
 Best:          12w 4d     Det. By:  U/S C R L (01/11/18)     EDD:   07/22/18
ANATOMY:
 Cranium:               Within Normal Limits   Bladder:                Seen
 Choroid Plexus:        Within normal limits   Upper Extremities:      Visualized
                        for gestational age
 Abdominal Wall:        Within normal limits   Lower Extremities:      Visualized
CERVIX UTERUS ADNEXA:
 Cervix
 Length:           4.48  cm.
MYOMAS:
  Site                     L(cm)      W(cm)      D(cm)      Location
  Right lower              1.6        1.7        1.8        Right Lateral low
  Blood Flow                 RI        PI       Comments

[Series 1: us mfm ob complete +14 wks · 0.30mm/px · 13 of 64 slices shown]
[im 3/64]
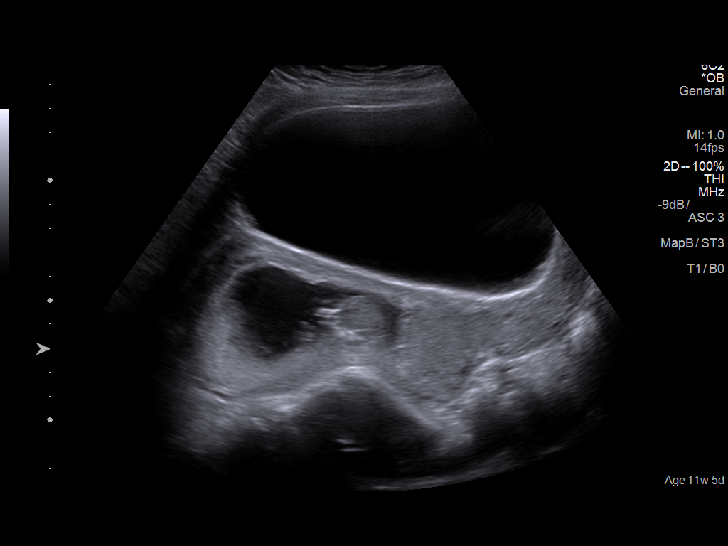
[im 8/64]
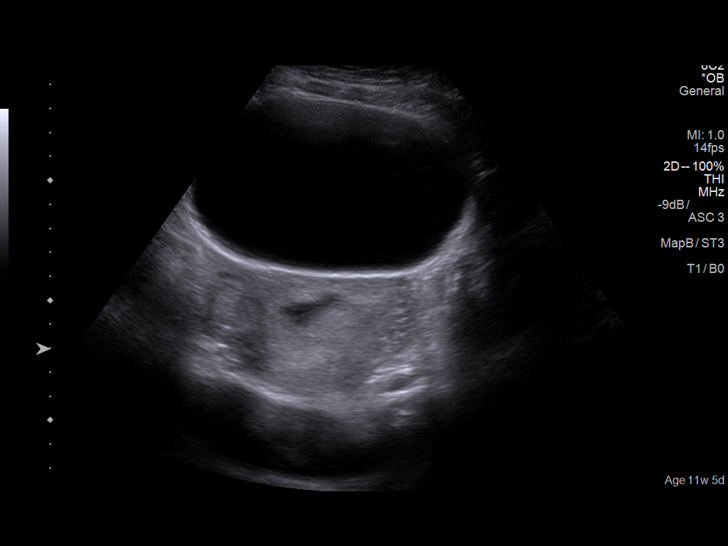
[im 12/64]
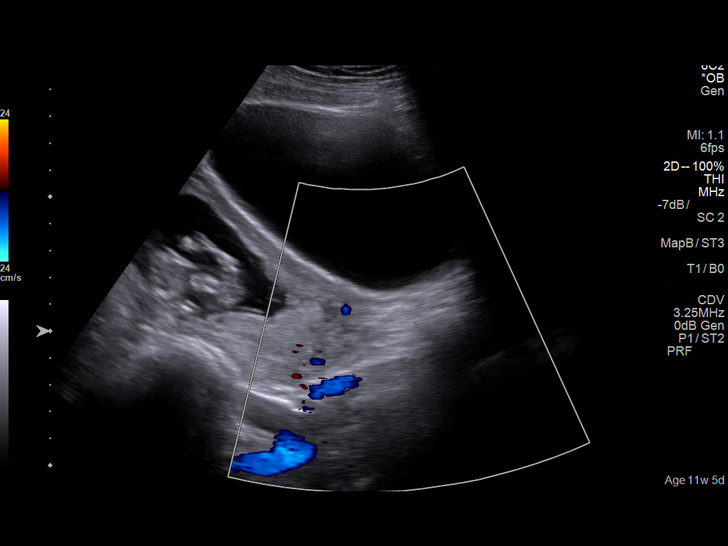
[im 17/64]
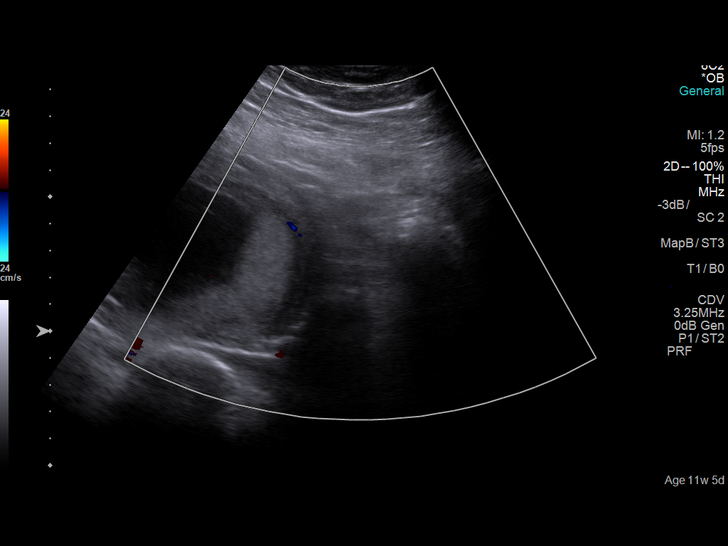
[im 22/64]
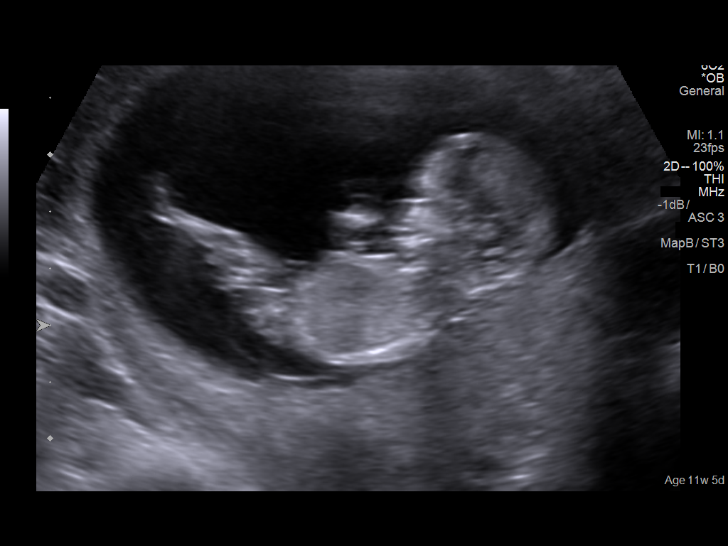
[im 26/64]
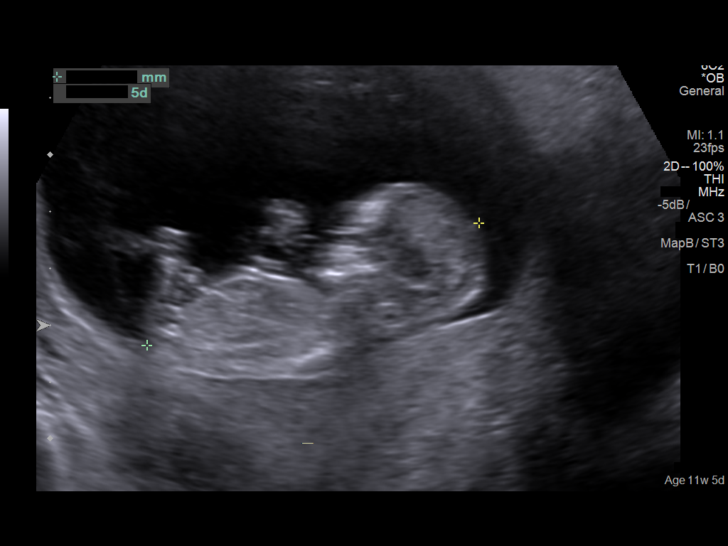
[im 33/64]
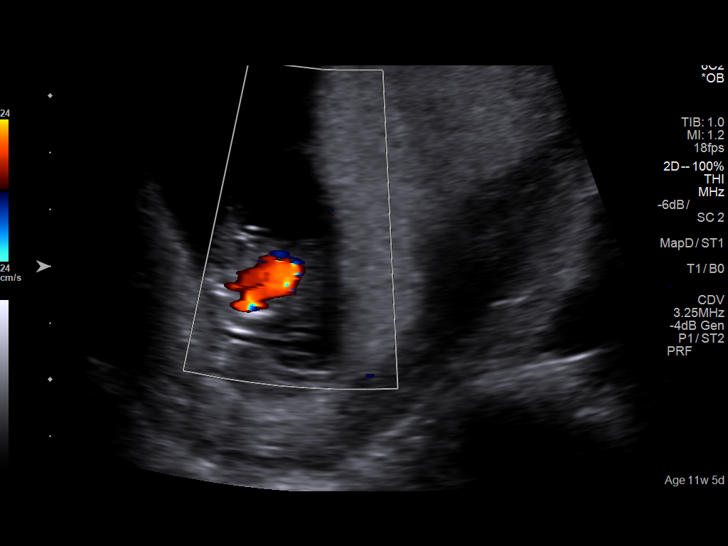
[im 38/64]
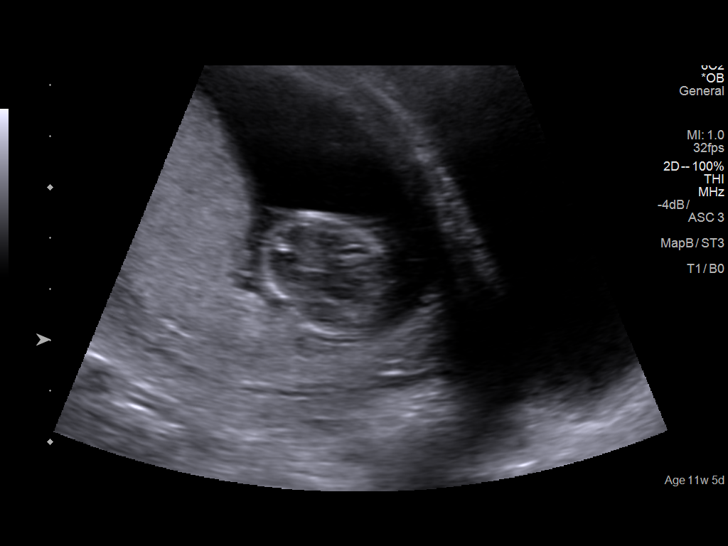
[im 43/64]
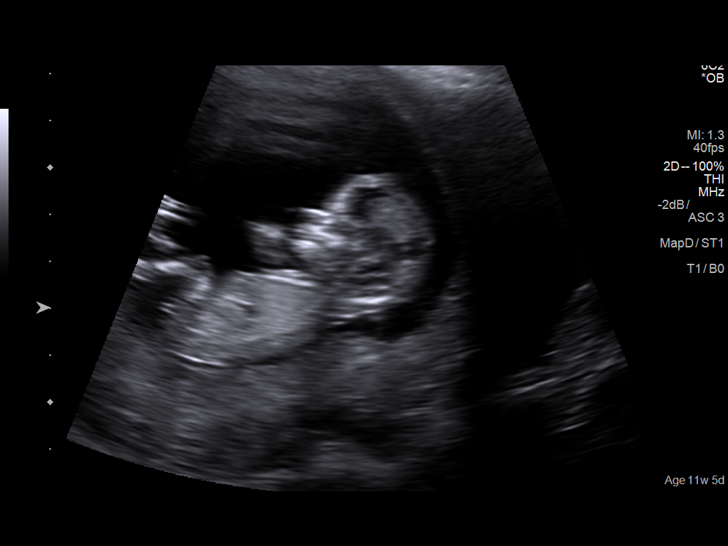
[im 47/64]
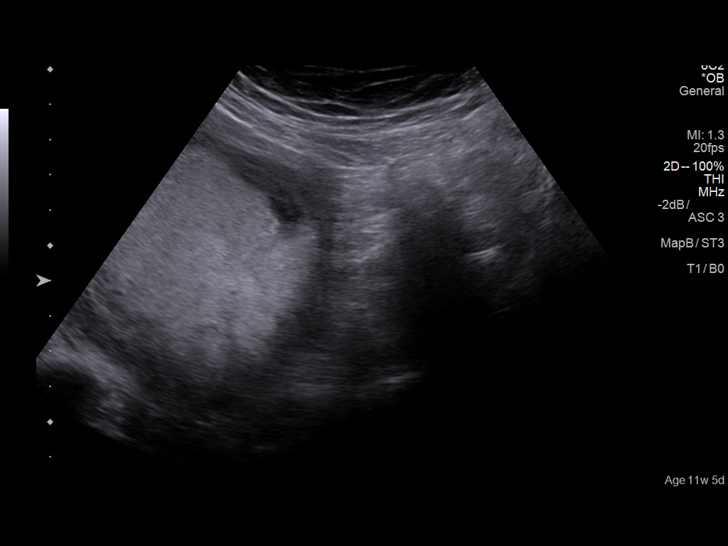
[im 52/64]
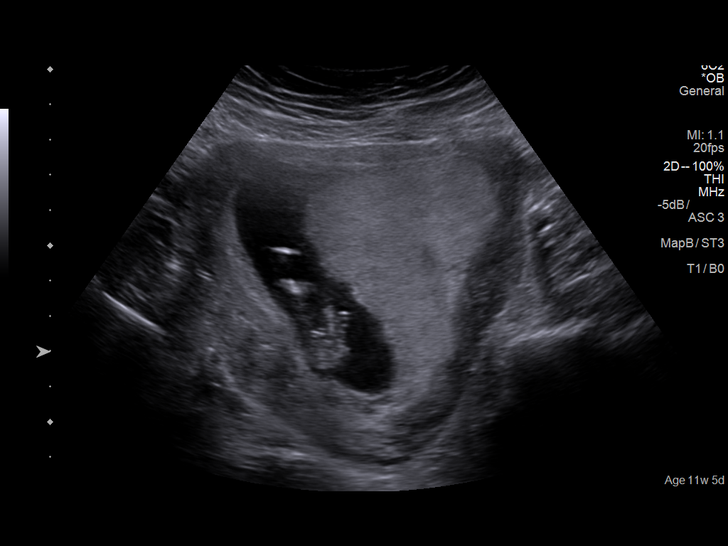
[im 57/64]
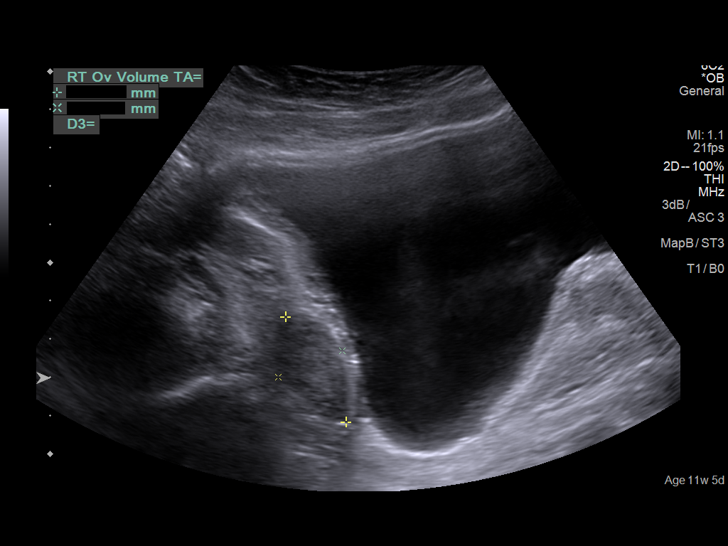
[im 61/64]
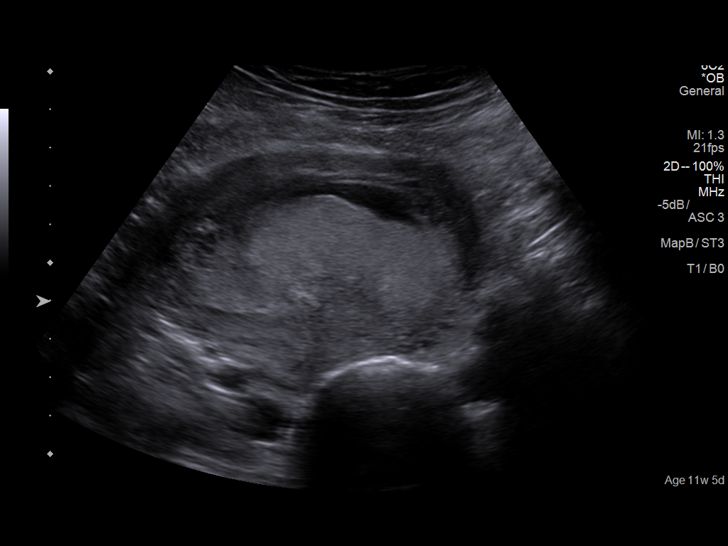

[13 of 28 positions shown; findings below may reference images not displayed]

IMPRESSION: Dear Dr. MUKHAMMAD,

 Thank you for referring your patient to [facility] for first
 trimester evaluation.

 A singleton gestation is noted at 23w6d by today's
 ultrasound.

 The fetal biometry is consistent.

 The nuchal translucency appeared normal

 The right ovary was visualized.

 The patient met with our genetic counselor.  There is a
 suspected family history of Down syndrome. The  patient was
 counseled about her genetic testing options and elected cell
 free fetal DNA testing.  The results will be sent to you shortly.

## 2020-12-01 ENCOUNTER — Encounter: Payer: Self-pay | Admitting: Family Medicine

## 2020-12-01 NOTE — Telephone Encounter (Signed)
Have called and left message that I need insurance card and a updated license with New name on it. I can not seem to get the insurance verified so we need the card.

## 2020-12-02 ENCOUNTER — Ambulatory Visit: Payer: Medicaid Other | Admitting: Gastroenterology

## 2020-12-07 ENCOUNTER — Ambulatory Visit: Payer: Medicaid Other | Admitting: Gastroenterology

## 2020-12-14 ENCOUNTER — Other Ambulatory Visit: Payer: Self-pay | Admitting: Obstetrics and Gynecology

## 2020-12-14 ENCOUNTER — Other Ambulatory Visit (HOSPITAL_COMMUNITY): Payer: Self-pay | Admitting: Obstetrics and Gynecology

## 2020-12-14 DIAGNOSIS — Z319 Encounter for procreative management, unspecified: Secondary | ICD-10-CM

## 2020-12-15 ENCOUNTER — Other Ambulatory Visit: Payer: Self-pay

## 2020-12-15 ENCOUNTER — Ambulatory Visit
Admission: RE | Admit: 2020-12-15 | Discharge: 2020-12-15 | Disposition: A | Discharge status: Home / Self Care | Payer: 59 | Source: Ambulatory Visit | Attending: Obstetrics and Gynecology | Admitting: Obstetrics and Gynecology

## 2020-12-15 DIAGNOSIS — Z319 Encounter for procreative management, unspecified: Secondary | ICD-10-CM | POA: Insufficient documentation

## 2020-12-15 DIAGNOSIS — N979 Female infertility, unspecified: Secondary | ICD-10-CM | POA: Diagnosis present

## 2020-12-15 MED ORDER — IOHEXOL 300 MG/ML  SOLN
25.0000 mL | Freq: Once | INTRAMUSCULAR | Status: AC | PRN
  Administered 2020-12-15: 25 mL

## 2020-12-15 NOTE — Procedures (Signed)
[id number]  04/08/1990 30 y.o. 12/15/20   Suzanne Kindler, MD  Hysterosalpingogram Procedure Note  Date of procedure: 12/15/2020   Pre-operative Diagnosis: Infertility, prior left salpingectomy  Post-operative Diagnosis: same, small filling defect on left cornua  Procedure: Hysterosalpingogram  Surgeon: Angelina Pih, MD  Assistant(s):  Radiology assistant.   Anesthesia: None  Estimated Blood Loss:  None         Complications:  None; patient tolerated the procedure well.         Disposition: To home         Condition: stable  Findings: Right sided fill and spill of the tube and a smooth endometrial contour was noted with a small filling defect on left wall with obtuse angle, suggesting submucosal fibroid.  Procedure Details  HSG procedure discussed with the patient.  Risks, complications, alternatives have been reviewed with her and she agrees to proceed.   The patient presented to the radiology lab and was identified as the correct patient and the procedure verified as an HSG. A verbal Time Out was held with all team members present and in agreement.  Speculum was inserted in to the vagina and the cervix visualized.  The cervix was cleaned with betadine solution. The HSG catheter was inserted and the balloon insufflated with approximately 1.5 ml of air.  Patient was then repositioned for fluoroscopy.  A total of 20 ml of contrast was used for the procedure. The patient tolerated the procedure well, no complications.   Results were reviewed with the patient at the time of the procedure. She verbalized understanding.  Will plan for hysteroscopy with possible operative myomectomy.   Suzanne Kindler, MD 12/15/2020

## 2021-01-13 ENCOUNTER — Other Ambulatory Visit: Payer: Self-pay | Admitting: Obstetrics and Gynecology

## 2021-01-27 ENCOUNTER — Other Ambulatory Visit: Payer: Self-pay

## 2021-01-27 ENCOUNTER — Encounter
Admission: RE | Admit: 2021-01-27 | Discharge: 2021-01-27 | Disposition: A | Discharge status: Home / Self Care | Payer: 59 | Source: Ambulatory Visit | Attending: Obstetrics and Gynecology | Admitting: Obstetrics and Gynecology

## 2021-01-27 NOTE — Patient Instructions (Addendum)
Your procedure is scheduled on: 02/05/21 Report to [place]. To find out your arrival time please call [phone] between 1PM - 3PM on 02/04/21 .  Remember: Instructions that are not followed completely may result in serious medical risk, up to and including death, or upon the discretion of your surgeon and anesthesiologist your surgery may need to be rescheduled.     _X__ 1. Do not eat food after midnight the night before your procedure.                 No gum chewing or hard candies. You may drink clear liquids up to 2 hours                 before you are scheduled to arrive for your surgery- DO not drink clear                 liquids within 2 hours of the start of your surgery.                 Clear Liquids include:  water, apple juice without pulp, clear carbohydrate                 drink such as Clearfast or Gatorade, Black Coffee or Tea (Do not add                 anything to coffee or tea). Diabetics water only  __X__2.  On the morning of surgery brush your teeth with toothpaste and water, you                 may rinse your mouth with mouthwash if you wish.  Do not swallow any              toothpaste of mouthwash.     _X__ 3.  No Alcohol for 24 hours before or after surgery.   _X__ 4.  Do Not Smoke or use e-cigarettes For 24 Hours Prior to Your Surgery.                 Do not use any chewable tobacco products for at least 6 hours prior to                 surgery.  ____  5.  Bring all medications with you on the day of surgery if instructed.   __X__  6.  Notify your doctor if there is any change in your medical condition      (cold, fever, infections).     Do not wear jewelry, make-up, hairpins, clips or nail polish. Do not wear lotions, powders, or perfumes.  Do not shave body 48 hours prior to surgery. Men may shave face and neck. Do not bring valuables to the hospital.    [facility] is not responsible for any  belongings or valuables.  Contacts, dentures/partials or body piercings may not be worn into surgery. Bring a case for your contacts, glasses or hearing aids, a denture cup will be supplied. Leave your suitcase in the car. After surgery it may be brought to your room. For patients admitted to the hospital, discharge time is determined by your treatment team.   Patients discharged the day of surgery will not be allowed to drive home.   Please read over the following fact sheets that you were given:     __X__ Take these medicines the morning of surgery with A SIP OF WATER:  1. dexlansoprazole (DEXILANT) 60 MG capsule  2.   3.   4.  5.  6.  ____ Fleet Enema (as directed)   __ __ Use CHG Soap/SAGE wipes as directed  ____ Use inhalers on the day of surgery  ____ Stop metformin/Janumet/Farxiga 2 days prior to surgery    ____ Take 1/2 of usual insulin dose the night before surgery. No insulin the morning          of surgery.   ____ Stop Blood Thinners Coumadin/Plavix/Xarelto/Pleta/Pradaxa/Eliquis/Effient/Aspirin  on   Or contact your Surgeon, Cardiologist or Medical Doctor regarding  ability to stop your blood thinners  __X__ Stop Anti-inflammatories 7 days before surgery such as Advil, Ibuprofen, Motrin,  BC or Goodies Powder, Naprosyn, Naproxen, Aleve, Aspirin   YOU MAY TAKE TYLENOL IF NEEDED  __X__ Stop all herbals or supplements, fish oil or vitamins  until after surgery.    ____ Bring C-Pap to the hospital.

## 2021-01-28 ENCOUNTER — Encounter: Payer: Self-pay | Admitting: Urgent Care

## 2021-01-28 ENCOUNTER — Other Ambulatory Visit
Admission: RE | Admit: 2021-01-28 | Discharge: 2021-01-28 | Disposition: A | Discharge status: Home / Self Care | Payer: 59 | Source: Ambulatory Visit | Attending: Obstetrics and Gynecology | Admitting: Obstetrics and Gynecology

## 2021-01-28 DIAGNOSIS — Z3169 Encounter for other general counseling and advice on procreation: Secondary | ICD-10-CM | POA: Diagnosis present

## 2021-01-28 LAB — CBC
HCT: 35 % — ABNORMAL LOW (ref 36.0–46.0)
Hemoglobin: 11.5 g/dL — ABNORMAL LOW (ref 12.0–15.0)
MCH: 27 pg (ref 26.0–34.0)
MCHC: 32.9 g/dL (ref 30.0–36.0)
MCV: 82.2 fL (ref 80.0–100.0)
Platelets: 347 10*3/uL (ref 150–400)
RBC: 4.26 MIL/uL (ref 3.87–5.11)
RDW: 13.8 % (ref 11.5–15.5)
WBC: 8 10*3/uL (ref 4.0–10.5)
nRBC: 0 % (ref 0.0–0.2)

## 2021-01-28 LAB — TYPE AND SCREEN
ABO/RH(D): A POS
Antibody Screen: NEGATIVE

## 2021-01-28 LAB — BASIC METABOLIC PANEL
Anion gap: 6 (ref 5–15)
BUN: 7 mg/dL (ref 6–20)
CO2: 24 mmol/L (ref 22–32)
Calcium: 8.6 mg/dL — ABNORMAL LOW (ref 8.9–10.3)
Chloride: 104 mmol/L (ref 98–111)
Creatinine, Ser: 0.58 mg/dL (ref 0.44–1.00)
GFR, Estimated: >60 mL/min (ref >60)
Glucose, Bld: 95 mg/dL (ref 70–99)
Potassium: 3.1 mmol/L — ABNORMAL LOW (ref 3.5–5.1)
Sodium: 134 mmol/L — ABNORMAL LOW (ref 135–145)

## 2021-01-28 NOTE — Progress Notes (Addendum)
°  [facility] Perioperative Services: Pre-Admission/Anesthesia Testing  Abnormal Lab Notification   Date: 01/28/21  Name: Suzanne Cross MRN:   [id number]  Re: Abnormal labs noted during PAT appointment   Notified:  Provider Name Provider Role Notification Mode  Benjaman Kindler, MD OB/GYN Surgeon Routed and/or faxed via [place] and Notes:  ABNORMAL LAB VALUE(S): Lab Results  Component Value Date   K 3.1 (L) 01/28/2021   Rudell Cobb is scheduled for an elective DILATATION AND CURETTAGE /HYSTEROSCOPY ([place])  WITH POSSIBLE MYOMECTOMY on 02/05/2021. In review of her medication reconciliation, it is noted that the patient is NOT taking any type of prescribed diuretic medications. Please note, in efforts to promote a safe and effective anesthetic course, per current guidelines/standards set by the [facility] anesthesia team, the minimal acceptable K+ level for the patient to proceed with general anesthesia is 3.0 mmol/L. With that being said, if the patient drops any lower, her elective procedure will need to be postponed until K+ is better optimized. Abnormal result is being forwarded to primary attending surgeon for review and consideration of optimization. Order placed to have K+ rechecked on the day of her procedure to ensure correction of the noted derangement.    This is a Community education officer; no formal response is required.  Honor Loh, MSN, APRN, FNP-C, CEN [facility]  Peri-operative Services Nurse Practitioner Phone: [phone] Fax: [phone] 01/28/21 12:10 PM

## 2021-02-05 ENCOUNTER — Other Ambulatory Visit: Payer: Self-pay

## 2021-02-05 ENCOUNTER — Ambulatory Visit: Payer: 59 | Admitting: Certified Registered"

## 2021-02-05 ENCOUNTER — Encounter
Admission: RE | Disposition: A | Discharge status: Home / Self Care | Payer: Self-pay | Source: Home / Self Care | Attending: Obstetrics and Gynecology

## 2021-02-05 ENCOUNTER — Ambulatory Visit
Admission: RE | Admit: 2021-02-05 | Discharge: 2021-02-05 | Disposition: A | Discharge status: Home / Self Care | Payer: 59 | Attending: Obstetrics and Gynecology | Admitting: Obstetrics and Gynecology

## 2021-02-05 ENCOUNTER — Encounter: Payer: Self-pay | Admitting: Obstetrics and Gynecology

## 2021-02-05 DIAGNOSIS — R948 Abnormal results of function studies of other organs and systems: Secondary | ICD-10-CM | POA: Insufficient documentation

## 2021-02-05 DIAGNOSIS — Z9079 Acquired absence of other genital organ(s): Secondary | ICD-10-CM | POA: Diagnosis not present

## 2021-02-05 DIAGNOSIS — Z79899 Other long term (current) drug therapy: Secondary | ICD-10-CM | POA: Insufficient documentation

## 2021-02-05 DIAGNOSIS — K219 Gastro-esophageal reflux disease without esophagitis: Secondary | ICD-10-CM | POA: Diagnosis not present

## 2021-02-05 DIAGNOSIS — Z90721 Acquired absence of ovaries, unilateral: Secondary | ICD-10-CM | POA: Insufficient documentation

## 2021-02-05 DIAGNOSIS — D649 Anemia, unspecified: Secondary | ICD-10-CM | POA: Insufficient documentation

## 2021-02-05 DIAGNOSIS — Z3169 Encounter for other general counseling and advice on procreation: Secondary | ICD-10-CM

## 2021-02-05 DIAGNOSIS — R102 Pelvic and perineal pain: Secondary | ICD-10-CM | POA: Diagnosis not present

## 2021-02-05 HISTORY — PX: HYSTEROSCOPY: SHX211

## 2021-02-05 LAB — POCT I-STAT, CHEM 8
BUN: 6 mg/dL (ref 6–20)
Calcium, Ion: 1.17 mmol/L (ref 1.15–1.40)
Chloride: 108 mmol/L (ref 98–111)
Creatinine, Ser: 0.5 mg/dL (ref 0.44–1.00)
Glucose, Bld: 100 mg/dL — ABNORMAL HIGH (ref 70–99)
HCT: 37 % (ref 36.0–46.0)
Hemoglobin: 12.6 g/dL (ref 12.0–15.0)
Potassium: 3.8 mmol/L (ref 3.5–5.1)
Sodium: 140 mmol/L (ref 135–145)
TCO2: 24 mmol/L (ref 22–32)

## 2021-02-05 SURGERY — HYSTEROSCOPY
Anesthesia: General | Site: Uterus

## 2021-02-05 MED ORDER — OXYCODONE HCL 5 MG PO TABS
5.0000 mg | ORAL_TABLET | ORAL | 0 refills | Status: DC | PRN
Start: 2021-02-05 — End: 2021-02-05

## 2021-02-05 MED ORDER — ONDANSETRON HCL 4 MG/2ML IJ SOLN
4.0000 mg | Freq: Once | INTRAMUSCULAR | Status: AC | PRN
  Administered 2021-02-05: 4 mg via INTRAVENOUS

## 2021-02-05 MED ORDER — OXYCODONE HCL 5 MG PO TABS: 5.0000 mg | ORAL_TABLET | ORAL | 0 refills | Status: DC | PRN

## 2021-02-05 MED ORDER — SUCCINYLCHOLINE CHLORIDE 200 MG/10ML IV SOSY
PREFILLED_SYRINGE | INTRAVENOUS | Status: AC
  Filled 2021-02-05: qty 10

## 2021-02-05 MED ORDER — CHLORHEXIDINE GLUCONATE 0.12 % MT SOLN
OROMUCOSAL | Status: AC
  Filled 2021-02-05: qty 15

## 2021-02-05 MED ORDER — IBUPROFEN 800 MG PO TABS
800.0000 mg | ORAL_TABLET | Freq: Three times a day (TID) | ORAL | 1 refills | Status: AC
Start: 2021-02-05 — End: 2021-02-08

## 2021-02-05 MED ORDER — PROPOFOL 10 MG/ML IV BOLUS
INTRAVENOUS | Status: DC | PRN
Start: 2021-02-05 — End: 2021-02-05
  Administered 2021-02-05: 100 mg via INTRAVENOUS

## 2021-02-05 MED ORDER — KETOROLAC TROMETHAMINE 30 MG/ML IJ SOLN
INTRAMUSCULAR | Status: DC | PRN
  Administered 2021-02-05: 30 mg via INTRAVENOUS

## 2021-02-05 MED ORDER — MIDAZOLAM HCL 2 MG/2ML IJ SOLN
INTRAMUSCULAR | Status: AC
  Filled 2021-02-05: qty 2

## 2021-02-05 MED ORDER — IBUPROFEN 800 MG PO TABS: 800.0000 mg | ORAL_TABLET | Freq: Three times a day (TID) | ORAL | 1 refills | Status: DC

## 2021-02-05 MED ORDER — ORAL CARE MOUTH RINSE: 15.0000 mL | Freq: Once | OROMUCOSAL | Status: AC

## 2021-02-05 MED ORDER — MIDAZOLAM HCL 2 MG/2ML IJ SOLN
INTRAMUSCULAR | Status: DC | PRN
  Administered 2021-02-05: 2 mg via INTRAVENOUS

## 2021-02-05 MED ORDER — SODIUM CHLORIDE 0.9 % IR SOLN
Status: DC | PRN
  Administered 2021-02-05: 600 mL

## 2021-02-05 MED ORDER — FENTANYL CITRATE (PF) 100 MCG/2ML IJ SOLN
25.0000 ug | INTRAMUSCULAR | Status: DC | PRN
  Administered 2021-02-05 (×3): 25 ug via INTRAVENOUS

## 2021-02-05 MED ORDER — POVIDONE-IODINE 10 % EX SWAB
2.0000 "application " | Freq: Once | CUTANEOUS | Status: AC
  Administered 2021-02-05: 2 via TOPICAL

## 2021-02-05 MED ORDER — SILVER NITRATE-POT NITRATE 75-25 % EX MISC
CUTANEOUS | Status: AC
  Filled 2021-02-05: qty 10

## 2021-02-05 MED ORDER — LACTATED RINGERS IV SOLN: INTRAVENOUS | Status: DC

## 2021-02-05 MED ORDER — FENTANYL CITRATE (PF) 100 MCG/2ML IJ SOLN
INTRAMUSCULAR | Status: DC | PRN
Start: 2021-02-05 — End: 2021-02-05
  Administered 2021-02-05 (×2): 50 ug via INTRAVENOUS

## 2021-02-05 MED ORDER — ONDANSETRON HCL 4 MG/2ML IJ SOLN
INTRAMUSCULAR | Status: DC | PRN
  Administered 2021-02-05: 4 mg via INTRAVENOUS

## 2021-02-05 MED ORDER — DOCUSATE SODIUM 100 MG PO CAPS: 100.0000 mg | ORAL_CAPSULE | Freq: Two times a day (BID) | ORAL | 0 refills | Status: DC

## 2021-02-05 MED ORDER — CHLORHEXIDINE GLUCONATE 0.12 % MT SOLN
15.0000 mL | Freq: Once | OROMUCOSAL | Status: AC
  Administered 2021-02-05: 15 mL via OROMUCOSAL

## 2021-02-05 MED ORDER — PROPOFOL 10 MG/ML IV BOLUS
INTRAVENOUS | Status: AC
  Filled 2021-02-05: qty 20

## 2021-02-05 MED ORDER — GLYCOPYRROLATE 0.2 MG/ML IJ SOLN
INTRAMUSCULAR | Status: DC | PRN
  Administered 2021-02-05: .2 mg via INTRAVENOUS

## 2021-02-05 MED ORDER — SUCCINYLCHOLINE CHLORIDE 200 MG/10ML IV SOSY
PREFILLED_SYRINGE | INTRAVENOUS | Status: DC | PRN
  Administered 2021-02-05: 100 mg via INTRAVENOUS

## 2021-02-05 MED ORDER — 0.9 % SODIUM CHLORIDE (POUR BTL) OPTIME
TOPICAL | Status: DC | PRN
Start: 2021-02-05 — End: 2021-02-05
  Administered 2021-02-05: 500 mL

## 2021-02-05 MED ORDER — LIDOCAINE HCL (CARDIAC) PF 100 MG/5ML IV SOSY
PREFILLED_SYRINGE | INTRAVENOUS | Status: DC | PRN
  Administered 2021-02-05: 60 mg via INTRAVENOUS

## 2021-02-05 MED ORDER — DEXAMETHASONE SODIUM PHOSPHATE 10 MG/ML IJ SOLN
INTRAMUSCULAR | Status: DC | PRN
  Administered 2021-02-05: 8 mg via INTRAVENOUS

## 2021-02-05 MED ORDER — FENTANYL CITRATE (PF) 100 MCG/2ML IJ SOLN
INTRAMUSCULAR | Status: AC
  Administered 2021-02-05: 25 ug via INTRAVENOUS
  Filled 2021-02-05: qty 2

## 2021-02-05 MED ORDER — ACETAMINOPHEN 10 MG/ML IV SOLN
INTRAVENOUS | Status: DC | PRN
  Administered 2021-02-05: 1000 mg via INTRAVENOUS

## 2021-02-05 MED ORDER — FENTANYL CITRATE (PF) 100 MCG/2ML IJ SOLN
INTRAMUSCULAR | Status: AC
  Filled 2021-02-05: qty 2

## 2021-02-05 MED ORDER — DOCUSATE SODIUM 100 MG PO CAPS: 100.0000 mg | ORAL_CAPSULE | Freq: Two times a day (BID) | ORAL | 2 refills | Status: DC | PRN

## 2021-02-05 MED ORDER — ONDANSETRON HCL 4 MG/2ML IJ SOLN
INTRAMUSCULAR | Status: AC
  Filled 2021-02-05: qty 2

## 2021-02-05 MED ORDER — LIDOCAINE HCL (PF) 2 % IJ SOLN
INTRAMUSCULAR | Status: AC
  Filled 2021-02-05: qty 5

## 2021-02-05 SURGICAL SUPPLY — 24 items
BAG INFUSER PRESSURE 100CC (MISCELLANEOUS) ×1 IMPLANT
DRSG TELFA 3X8 NADH (GAUZE/BANDAGES/DRESSINGS) IMPLANT
ELECT REM PT RETURN 9FT ADLT (ELECTROSURGICAL) ×3
ELECTRODE REM PT RTRN 9FT ADLT (ELECTROSURGICAL) ×2 IMPLANT
GAUZE 4X4 16PLY ~~LOC~~+RFID DBL (SPONGE) ×4 IMPLANT
GLOVE SURG ENC MOIS LTX SZ7 (GLOVE) ×3 IMPLANT
GLOVE SURG UNDER LTX SZ7.5 (GLOVE) ×3 IMPLANT
GOWN STRL REUS W/ TWL LRG LVL3 (GOWN DISPOSABLE) ×4 IMPLANT
GOWN STRL REUS W/TWL LRG LVL3 (GOWN DISPOSABLE) ×6
INFUSOR MANOMETER BAG 3000ML (MISCELLANEOUS) ×2 IMPLANT
IV NS IRRIG 3000ML ARTHROMATIC (IV SOLUTION) ×3 IMPLANT
KIT PROCEDURE FLUENT (KITS) ×1 IMPLANT
KIT TURNOVER CYSTO (KITS) ×3 IMPLANT
MANIFOLD NEPTUNE II (INSTRUMENTS) ×3 IMPLANT
PACK DNC HYST (MISCELLANEOUS) ×3 IMPLANT
PAD DRESSING TELFA 3X8 NADH (GAUZE/BANDAGES/DRESSINGS) IMPLANT
PAD OB MATERNITY 4.3X12.25 (PERSONAL CARE ITEMS) ×3 IMPLANT
PAD PREP 24X41 OB/GYN DISP (PERSONAL CARE ITEMS) ×3 IMPLANT
SCRUB EXIDINE 4% CHG 4OZ (MISCELLANEOUS) ×3 IMPLANT
SEAL ROD LENS SCOPE MYOSURE (ABLATOR) ×3 IMPLANT
SET CYSTO W/LG BORE CLAMP LF (SET/KITS/TRAYS/PACK) ×2 IMPLANT
SOL PREP POV-IOD 4OZ 10% (MISCELLANEOUS) ×2 IMPLANT
TUBING CONNECTING 10 (TUBING) ×3 IMPLANT
WATER STERILE IRR 500ML POUR (IV SOLUTION) ×1 IMPLANT

## 2021-02-05 NOTE — Discharge Instructions (Addendum)
Discharge instructions after a hysteroscopy  Signs and Symptoms to Report  Call our office at [phone] if you have any of the following:    Fever over 100.4 degrees or higher  Severe stomach pain not relieved with pain medications  Bright red bleeding thats heavier than a period that does not slow with rest after the first 24 hours  To go the bathroom a lot (frequency), you cant hold your urine (urgency), or it hurts when you empty your bladder (urinate)  Chest pain  Shortness of breath  Pain in the calves of your legs  Severe nausea and vomiting not relieved with anti-nausea medications  Any concerns  What You Can Expect after Surgery  You may see some pink tinged, bloody fluid. This is normal. You may also have cramping for several days.   Activities after Your Discharge Follow these guidelines to help speed your recovery at home:  Dont drive if you are in pain or taking narcotic pain medicine. You may drive when you can safely slam on the brakes, turn the wheel forcefully, and rotate your torso comfortably. This is typically 4-7 days. Practice in a parking lot or side street prior to attempting to drive regularly.   Ask others to help with household chores for 4 weeks.  Dont do strenuous activities, exercises, or sports like vacuuming, tennis, squash, etc. until your doctor says it is safe to do so.  Walk as you feel able. Rest often since it may take a week or two for your energy level to return to normal.   You may climb stairs  Avoid constipation:   -Eat fruits, vegetables, and whole grains. Eat small meals as your appetite will take time to return to normal.   -Drink 6 to 8 glasses of water each day unless your doctor has told you to limit your fluids.   -Use a laxative or stool softener as needed if constipation becomes a problem. You may take Miralax, metamucil, Citrucil, Colace, Senekot, FiberCon, etc. If this does not relieve the constipation, try two tablespoons of  Milk Of Magnesia every 8 hours until your bowels move.   You may shower.   Do not get in a hot tub, swimming pool, etc. until your doctor agrees.  Do not douche, use tampons, or have sex until your doctor says it is okay, usually about 2 weeks.  Take your pain medicine when you need it. The medicine may not work as well if the pain is bad.  Take the medicines you were taking before surgery. Other medications you might need are pain medications (ibuprofen), medications for constipation (Colace) and nausea medications (Zofran).    AMBULATORY SURGERY  DISCHARGE INSTRUCTIONS   The drugs that you were given will stay in your system until tomorrow so for the next 24 hours you should not:  Drive an automobile Make any legal decisions Drink any alcoholic beverage   You may resume regular meals tomorrow.  Today it is better to start with liquids and gradually work up to solid foods.  You may eat anything you prefer, but it is better to start with liquids, then soup and crackers, and gradually work up to solid foods.   Please notify your doctor immediately if you have any unusual bleeding, trouble breathing, redness and pain at the surgery site, drainage, fever, or pain not relieved by medication.    Additional Instructions:        Please contact your physician with any problems or Same Day  Surgery at [phone], Monday through Friday 6 am to 4 pm, or [place] [facility] number at [phone].

## 2021-02-05 NOTE — Progress Notes (Signed)
Called patient X3 and left a voicemail with callback number. Patient has not shown up for her procedure. MD notified.

## 2021-02-05 NOTE — Anesthesia Preprocedure Evaluation (Signed)
Anesthesia Evaluation  Patient identified by MRN, date of birth, ID band Patient awake    Reviewed: Allergy & Precautions, H&P , NPO status , Patient's Chart, lab work & pertinent test results, reviewed documented beta blocker date and time   History of Anesthesia Complications (+) history of anesthetic complications  Airway Mallampati: II  TM Distance: >3 FB Neck ROM: full    Dental  (+) Teeth Intact   Pulmonary neg pulmonary ROS,    Pulmonary exam normal        Cardiovascular Exercise Tolerance: Good negative cardio ROS Normal cardiovascular exam Rate:Normal     Neuro/Psych negative neurological ROS  negative psych ROS   GI/Hepatic Neg liver ROS, GERD  Medicated,  Endo/Other  negative endocrine ROS  Renal/GU negative Renal ROS  negative genitourinary   Musculoskeletal   Abdominal   Peds  Hematology  (+) Blood dyscrasia, anemia ,   Anesthesia Other Findings   Reproductive/Obstetrics negative OB ROS                             Anesthesia Physical Anesthesia Plan  ASA: 2  Anesthesia Plan: General LMA   Post-op Pain Management:    Induction:   PONV Risk Score and Plan: 4 or greater  Airway Management Planned:   Additional Equipment:   Intra-op Plan:   Post-operative Plan:   Informed Consent: I have reviewed the patients History and Physical, chart, labs and discussed the procedure including the risks, benefits and alternatives for the proposed anesthesia with the patient or authorized representative who has indicated his/her understanding and acceptance.       Plan Discussed with: CRNA  Anesthesia Plan Comments:         Anesthesia Quick Evaluation

## 2021-02-05 NOTE — H&P (Signed)
Chief Complaint:   @M @ @LNAME @ is a @AGE @ @SEX @ here for @CHIEFCOMPLAINTN @.  History of Present Illness: Patient presents for a preoperative visit to schedule a D&C, hysteroscopy, and possible submucosal myomectomy.  HSG on 12/15/20 with left cornual filling defect, consistent with submucosal fibroid and possibly related to cornual ectopic removal by Dr. Leonides Schanz 5/19. Dr. Leonides Schanz also found endometriosis on that laparoscopic surgery as well.  She is TTC without success.   Periods are predictable, last 3-5 days. Not too heavy  She is using letrozole first 3-7 days of cycle.  She has been using letrozole for 1 year now.    Also, has pelvic pain intermittently on the left. Normal colonoscopy and endoscopy.    Pertinent Hx: - s/p 1 cesarean delivery  - Last pap 08/2018 neg - hx of 2 ectopic pregnancies.              - 02/2016 laparoscopic removal of ectopic pregnancy and left salpingectomy              - 05/2017 Dr. Leonides Schanz did dx lap with removal of cornual ectopic pregnancy (left) and resection of uterus (left)  - Ovarian cystectomy 09/2015 - Left oophorectomy and cystectomy 08/2016              - Normal appearing uterus and right tube. + dye flow through right tube. Right ovary with 4 cm simple cyst. Right ovary free. Left ovary with 3 cm endometrioma. Left ovary adherent to pelvic sidewall and left uterosacral ligament - Endometriosis, hx of endometriomas   Endometriosis, hx of endometriomas    Past Medical History:  has a past medical history of Endometriosis (2015), ectopic pregnancy (2017), and Other idiopathic scoliosis, thoracic region.  Past Surgical History:  has a past surgical history that includes Salpingectomy (Left, 02/2016); Pelvic laparoscopy (2017); Salpingectomy (Left, 05/2017); Cesarean section; IUD insertion; and Oophorectomy (Left, 2018). Family History: family history includes Arthritis in her mother; Clotting disorder in her maternal grandmother; High blood pressure  (Hypertension) in her maternal grandmother and mother; Kidney cancer in her maternal aunt; Mental retardation in her sister; No Known Problems in her father; Pulmonary embolism (age of onset: 66) in her maternal grandmother. Social History:  reports that she has never smoked. She has never used smokeless tobacco. She reports previous alcohol use. She reports that she does not use drugs. OB/GYN History:          OB History     Gravida  3   Para  1   Term  1   Preterm      AB  2   Living  1      SAB      IAB      Ectopic  2   Molar      Multiple      Live Births  1           Allergies: is allergic to propofol. Medications: Current Outpatient Medications:    dexlansoprazole (DEXILANT) 60 mg DR capsule, Take 60 mg by mouth once daily, Disp: , Rfl:    letrozole (FEMARA) 2.5 mg tablet, TAKE 7 MILLIGRAMS ON DAYS 3-7 FOLLOWING PERIOD, Disp: 45 tablet, Rfl: 1   prenatal vitamin-iron-FA-DHA (PNV-DHA) 27 mg iron-1 mg -300 mg capsule, Take 1 capsule by mouth once daily, Disp: 30 capsule, Rfl: 1   progesterone (PROMETRIUM) 200 MG capsule, 3 days after ovulation insert progesterone vaginally and continue once nightly through first trimester, Disp: 30 capsule, Rfl: 11  docusate (COLACE) 100 MG capsule, Take 1 capsule (100 mg total) by mouth 2 (two) times daily (Patient not taking: No sig reported), Disp: 60 capsule, Rfl: 1   etonogestreL-ethinyl estradioL (NUVARING) 0.12-0.015 mg/24 hr vaginal ring, Place 1 ring vaginally monthly Leave in place for 3 consecutive weeks, then remove for 1 week. (Patient not taking: No sig reported), Disp: 1 ring, Rfl: 11   famotidine (PEPCID) 20 MG tablet, Take 20 mg by mouth 2 (two) times daily (Patient not taking: No sig reported), Disp: , Rfl:    ferrous sulfate 325 (65 FE) MG tablet, Take 325 mg by mouth daily with breakfast (Patient not taking: No sig reported), Disp: , Rfl:    Review of Systems: No SOB, no palpitations or chest pain, no new lower  extremity edema, no nausea or vomiting or bowel or bladder complaints. See HPI for gyn specific ROS.    Exam:    BP 117/75    Pulse 73    Ht 157.5 cm (5\' 2" )    Wt 85.7 kg (189 lb)    LMP 11/11/2020    BMI 34.57 kg/m     Constitutional:  General appearance: Well nourished, well developed female in no acute distress.  CV: RRR Pulm: CTAB Neuro/psych:  Normal mood and affect. No gross motor deficits. Neck:  Supple, normal appearance.  Respiratory:  Normal respiratory effort, no use of accessory muscles Skin:  No visible rashes or external lesions   +Carnette's on LEFT      Pelvic: deferred   Impression:   @DIAGP @  Plan:   1.  Preoperative visit: D&C hysteroscopy, possible myosure myomectomy for TTC with uterine defect noted on HSG. Consents signed today.  -Risks of surgery were discussed with the patient including but not limited to: bleeding which may require transfusion; infection which may require antibiotics; injury to uterus or surrounding organs; intrauterine scarring which may impair future fertility; need for additional procedures including laparotomy or laparoscopy; and other postoperative/anesthesia complications. Written informed consent was obtained.  This is a scheduled same-day surgery. She will have a postop visit in 2 weeks to review operative findings and pathology.  Benjaman Kindler 02/05/21

## 2021-02-05 NOTE — Transfer of Care (Signed)
Immediate Anesthesia Transfer of Care Note  Patient: Suzanne Cross  Procedure(s) Performed: Hysteroscopy  (Uterus)  Patient Location: PACU  Anesthesia Type:General  Level of Consciousness: awake, alert  and oriented  Airway & Oxygen Therapy: Patient Spontanous Breathing  Post-op Assessment: Report given to RN and Post -op Vital signs reviewed and stable  Post vital signs: Reviewed and stable  Last Vitals:  Vitals Value Taken Time  BP 114/74 02/05/21 1136  Temp 36.1 C 02/05/21 1136  Pulse 92 02/05/21 1137  Resp 20 02/05/21 1137  SpO2 98 % 02/05/21 1137  Vitals shown include unvalidated device data.  Last Pain:  Vitals:   02/05/21 1136  TempSrc:   PainSc: 0-No pain         Complications: No notable events documented.

## 2021-02-05 NOTE — Anesthesia Procedure Notes (Addendum)
Procedure Name: Intubation Date/Time: 02/05/2021 10:42 AM Performed by: Hedda Slade, CRNA Pre-anesthesia Checklist: Patient identified, Patient being monitored, Timeout performed, Emergency Drugs available and Suction available Patient Re-evaluated:Patient Re-evaluated prior to induction Oxygen Delivery Method: Circle system utilized Preoxygenation: Pre-oxygenation with 100% oxygen Induction Type: IV induction Ventilation: Mask ventilation without difficulty Laryngoscope Size: 3 and McGraph Grade View: Grade I Tube type: Oral Tube size: 7.0 mm Number of attempts: 1 Airway Equipment and Method: Stylet Placement Confirmation: ETT inserted through vocal cords under direct vision, positive ETCO2 and breath sounds checked- equal and bilateral Secured at: 21 cm Tube secured with: Tape Dental Injury: Teeth and Oropharynx as per pre-operative assessment

## 2021-02-05 NOTE — Op Note (Signed)
Operative Report Hysteroscopy with Dilation and Curettage   Indications: Uterine defect  Pre-operative Diagnosis: Possible submucosal fibroid    Post-operative Diagnosis: same.  Procedure: 1. Exam under anesthesia 2. D&C 3. Hysteroscopy  Surgeon: Benjaman Kindler, MD  Assistant(s):  None  Anesthesia: General LMA anesthesia  Anesthesiologist: [name], MD Anesthesiologist: [name], MD CRNA: Hedda Slade, CRNA  Estimated Blood Loss:  Minimal         Intraoperative medications:  toradol         Total IV Fluids: 729ml  Urine Output: 37ml  Total Fluid Deficit:  n/a          Specimens: None         Complications:  None; patient tolerated the procedure well.         Disposition: PACU - hemodynamically stable.         Condition: stable  Findings: Uterus measuring 7.5 cm by sound; normal cervix, vagina, perineum. Left tubal ostium not seen, right tubal ostium appears wnl. Small left cornual defect noted in the uterine wall proximal to the ostium but filled with proliferative tissue today. No space filling lesions in the cavity visualized. Appears normal for attempting pregnancy, which is her goal.  Indication for procedure/Consents: 31 y.o. [id number]  here for scheduled surgery for the aforementioned diagnoses.    Risks of surgery were discussed with the patient including but not limited to: bleeding which may require transfusion; infection which may require antibiotics; injury to uterus or surrounding organs; intrauterine scarring which may impair future fertility; need for additional procedures including laparotomy or laparoscopy; and other postoperative/anesthesia complications. Written informed consent was obtained.    Procedure Details:  D&C only  The patient was taken to the operating room where anesthesia was administered and was found to be adequate.  After a formal and adequate timeout was performed, she was placed in the dorsal lithotomy position and  examined with the above findings. She was then prepped and draped in the sterile manner.   Her bladder was catheterized for an estimated amount of clear, yellow urine. A weighed speculum was then placed in the patient's vagina and a single tooth tenaculum was applied to the anterior lip of the cervix.  Her cervix was serially dilated to 15 [place] using Hanks dilators.  The hysteroscope was introduced to reveal the above findings. The tenaculum was removed from the anterior lip of the cervix and the vaginal speculum was removed after applying silver nitrate for good hemostasis.   The patient tolerated the procedure well and was taken to the recovery area awake and in stable condition. She received iv acetaminophen and Toradol prior to leaving the OR.  The patient will be discharged to home as per PACU criteria. Routine postoperative instructions given.  She was prescribed Ibuprofen and Colace.  She will follow up in the clinic in two weeks for postoperative evaluation.

## 2021-02-22 NOTE — Progress Notes (Signed)
No submucosal fibroid found on hysteroscopy

## 2021-03-07 NOTE — Anesthesia Postprocedure Evaluation (Signed)
Anesthesia Post Note  Patient: Marika Mahaffy  Procedure(s) Performed: Hysteroscopy  (Uterus)  Patient location during evaluation: PACU Anesthesia Type: General Level of consciousness: awake and alert Pain management: pain level controlled Vital Signs Assessment: post-procedure vital signs reviewed and stable Respiratory status: spontaneous breathing, nonlabored ventilation, respiratory function stable and patient connected to nasal cannula oxygen Cardiovascular status: blood pressure returned to baseline and stable Postop Assessment: no apparent nausea or vomiting Anesthetic complications: no   No notable events documented.   Last Vitals:  Vitals:   02/05/21 1220 02/05/21 1227  BP: 96/69 105/72  Pulse: 71 77  Resp: 12 18  Temp:  36.7 C  SpO2: 99% 99%    Last Pain:  Vitals:   02/07/21 1220  TempSrc:   PainSc: 0-No pain                 [name]

## 2021-07-28 ENCOUNTER — Encounter: Payer: Self-pay | Admitting: Gastroenterology

## 2021-07-29 ENCOUNTER — Other Ambulatory Visit: Payer: Self-pay

## 2021-07-29 ENCOUNTER — Other Ambulatory Visit: Payer: Self-pay | Admitting: Gastroenterology

## 2021-07-29 MED ORDER — DEXLANSOPRAZOLE 60 MG PO CPDR: 60.0000 mg | DELAYED_RELEASE_CAPSULE | Freq: Every day | ORAL | 3 refills | Status: DC

## 2021-07-30 ENCOUNTER — Other Ambulatory Visit: Payer: Self-pay

## 2021-07-30 MED ORDER — ESOMEPRAZOLE MAGNESIUM 40 MG PO CPDR: 40.0000 mg | DELAYED_RELEASE_CAPSULE | Freq: Every day | ORAL | 11 refills | Status: DC

## 2021-10-01 DIAGNOSIS — Z8759 Personal history of other complications of pregnancy, childbirth and the puerperium: Secondary | ICD-10-CM | POA: Diagnosis not present

## 2021-10-01 DIAGNOSIS — N944 Primary dysmenorrhea: Secondary | ICD-10-CM | POA: Diagnosis not present

## 2021-10-01 DIAGNOSIS — N809 Endometriosis, unspecified: Secondary | ICD-10-CM | POA: Diagnosis not present

## 2021-10-01 DIAGNOSIS — Z319 Encounter for procreative management, unspecified: Secondary | ICD-10-CM | POA: Diagnosis not present

## 2021-10-05 ENCOUNTER — Other Ambulatory Visit: Payer: Self-pay | Admitting: Obstetrics and Gynecology

## 2021-10-08 DIAGNOSIS — Z8759 Personal history of other complications of pregnancy, childbirth and the puerperium: Secondary | ICD-10-CM | POA: Diagnosis not present

## 2021-10-08 DIAGNOSIS — N944 Primary dysmenorrhea: Secondary | ICD-10-CM | POA: Diagnosis not present

## 2021-10-08 DIAGNOSIS — N809 Endometriosis, unspecified: Secondary | ICD-10-CM | POA: Diagnosis not present

## 2021-10-08 DIAGNOSIS — Z319 Encounter for procreative management, unspecified: Secondary | ICD-10-CM | POA: Diagnosis not present

## 2021-10-12 ENCOUNTER — Other Ambulatory Visit: Payer: Self-pay

## 2021-10-12 ENCOUNTER — Encounter
Admission: RE | Admit: 2021-10-12 | Discharge: 2021-10-12 | Disposition: A | Discharge status: Home / Self Care | Payer: 59 | Source: Ambulatory Visit | Attending: Obstetrics and Gynecology | Admitting: Obstetrics and Gynecology

## 2021-10-12 DIAGNOSIS — Z319 Encounter for procreative management, unspecified: Secondary | ICD-10-CM

## 2021-10-12 DIAGNOSIS — Z8759 Personal history of other complications of pregnancy, childbirth and the puerperium: Secondary | ICD-10-CM

## 2021-10-12 DIAGNOSIS — O009 Unspecified ectopic pregnancy without intrauterine pregnancy: Secondary | ICD-10-CM

## 2021-10-12 DIAGNOSIS — N979 Female infertility, unspecified: Secondary | ICD-10-CM

## 2021-10-12 DIAGNOSIS — N809 Endometriosis, unspecified: Secondary | ICD-10-CM

## 2021-10-12 DIAGNOSIS — Z01812 Encounter for preprocedural laboratory examination: Secondary | ICD-10-CM

## 2021-10-12 HISTORY — DX: Other specified postprocedural states: Z98.890

## 2021-10-12 NOTE — Patient Instructions (Addendum)
Your procedure is scheduled on: 10/15/21 - Friday Report to the Registration Desk on the 1st floor of the [facility]. To find out your arrival time, please call [phone] between 1PM - 3PM on: 10/14/21 - Thursday If your arrival time is 6:00 am, do not arrive prior to that time as the [place] entrance doors do not open until 6:00 am.  REMEMBER: Instructions that are not followed completely may result in serious medical risk, up to and including death; or upon the discretion of your surgeon and anesthesiologist your surgery may need to be rescheduled.  Do not eat food after midnight the night before surgery.  No gum chewing, lozengers or hard candies.  You may however, drink CLEAR liquids up to 2 hours before you are scheduled to arrive for your surgery. Do not drink anything within 2 hours of your scheduled arrival time.  Clear liquids include: - water  - apple juice without pulp - gatorade (not RED colors) - black coffee or tea (Do NOT add milk or creamers to the coffee or tea) Do NOT drink anything that is not on this list.  TAKE THESE MEDICATIONS THE MORNING OF SURGERY WITH A SIP OF WATER:  - esomeprazole ([place]) 40, (take one the night before and one on the morning of surgery - helps to prevent nausea after surgery.)  One week prior to surgery: Stop Anti-inflammatories (NSAIDS) such as Advil, Aleve, Ibuprofen, Motrin, Naproxen, Naprosyn and Aspirin based products such as Excedrin, Goodys Powder, BC Powder.  Stop ANY OVER THE COUNTER supplements until after surgery.  You may however, continue to take Tylenol if needed for pain up until the day of surgery.  No Alcohol for 24 hours before or after surgery.  No Smoking including e-cigarettes for 24 hours prior to surgery.  No chewable tobacco products for at least 6 hours prior to surgery.  No nicotine patches on the day of surgery.  Do not use any "recreational" drugs for at least a week prior to your surgery.   Please be advised that the combination of cocaine and anesthesia may have negative outcomes, up to and including death. If you test positive for cocaine, your surgery will be cancelled.  On the morning of surgery brush your teeth with toothpaste and water, you may rinse your mouth with mouthwash if you wish. Do not swallow any toothpaste or mouthwash.  Do not wear jewelry, make-up, hairpins, clips or nail polish.  Do not wear lotions, powders, or perfumes.   Do not shave body from the neck down 48 hours prior to surgery just in case you cut yourself which could leave a site for infection.  Also, freshly shaved skin may become irritated if using the CHG soap.  Contact lenses, hearing aids and dentures may not be worn into surgery.  Do not bring valuables to the hospital. [facility] is not responsible for any missing/lost belongings or valuables.   Notify your doctor if there is any change in your medical condition (cold, fever, infection).  Wear comfortable clothing (specific to your surgery type) to the hospital.  After surgery, you can help prevent lung complications by doing breathing exercises.  Take deep breaths and cough every 1-2 hours. Your doctor may order a device called an Incentive Spirometer to help you take deep breaths. When coughing or sneezing, hold a pillow firmly against your incision with both hands. This is called "splinting." Doing this helps protect your incision. It also decreases belly discomfort.  If you are  being admitted to the hospital overnight, leave your suitcase in the car. After surgery it may be brought to your room.  If you are being discharged the day of surgery, you will not be allowed to drive home. You will need a responsible adult (18 years or older) to drive you home and stay with you that night.   If you are taking public transportation, you will need to have a responsible adult (18 years or older) with you. Please confirm with your  physician that it is acceptable to use public transportation.   Please call the [place] Dept. at [phone] if you have any questions about these instructions.  Surgery Visitation Policy:  Patients undergoing a surgery or procedure may have two family members or support persons with them as long as the person is not COVID-19 positive or experiencing its symptoms.   Inpatient Visitation:    Visiting hours are 7 a.m. to 8 p.m. Up to four visitors are allowed at one time in a patient room, including children. The visitors may rotate out with other people during the day. One designated support person (adult) may remain overnight.

## 2021-10-12 NOTE — H&P (Signed)
Suzanne Cross is a 31 y.o. female here for Pre Op Consulting (Sign consents)   Referring provider: Genice Rouge*   History of Present Illness: Patient returns today for preop. She has been TTC for almost 2 years now without success. She has underwent an HSG and a hysteroscopy this year with me. She has had 5 prior pelvic surgeries: 4 laparoscopies and 1 c/s    12/2020 HSG Smooth concavity of the left uterine wall with obtuse margins,  suggesting a submucosal or intramural process, could be scarring  with tissue retraction from prior surgery or possibly a fibroid.  Follow-up ultrasound or MRI would provide more anatomic detail.   Normal spillage from the right fallopian tube. Prior resection of  the left-sided fallopian tube.    01/2021 Hysteroscopy  Uterus measuring 7.5 cm by sound; normal cervix, vagina, perineum. Left tubal ostium not seen, right tubal ostium appears wnl. Small left cornual defect noted in the uterine wall proximal to the ostium but filled with proliferative tissue today. No space filling lesions in the cavity visualized. Appears normal for attempting pregnancy, which is her goal.   --> When she does become pregnant, will get serial betas to ensure pregnancy is progressing properly and not ectopic d/t her hx and concern. Once pregnancy is established and hcg rising appropriately, would plan on early ultrasound past the discriminatory threshold to evaluate pregnancy of unknown location.   Today: She wants to schedule her laparoscopy. Her periods are becoming heavier and more painful. She has tried 3 medicated IUIs and has not had a successful pregnancy yet. She would like to get another laparoscopy with excision of endometriosis with chromotubation prior to increasing her medications with her IUI.    Possible endometrioma on ultrasound at fertility clinic. Ultrasound not in our records.    Pertinent Hx: - s/p 1 cesarean delivery  - Last pap 08/2018 neg - hx of 2  ectopic pregnancies.              - 02/2016 laparoscopic removal of ectopic pregnancy and left salpingectomy              - 05/2017 Dr. Leonides Schanz did dx lap with removal of cornual ectopic pregnancy (left) and resection of uterus (left)  - Ovarian cystectomy 09/2015 - Left oophorectomy and cystectomy 08/2016              - Normal appearing uterus and right tube. + dye flow through right tube. Right ovary with 4 cm simple cyst. Right ovary free. Left ovary with 3 cm endometrioma. Left ovary adherent to pelvic sidewall and left uterosacral ligament - Endometriosis, hx of endometriomas    Past Medical History:  has a past medical history of Endometriosis (2015), ectopic pregnancy (2017), and Other idiopathic scoliosis, thoracic region.  Past Surgical History:  has a past surgical history that includes Salpingectomy (Left, 02/2016); Pelvic laparoscopy (2017); Salpingectomy (Left, 05/2017); Cesarean section; IUD insertion; and Oophorectomy (Left, 2018). Family History: family history includes Arthritis in her mother; Clotting disorder in her maternal grandmother; High blood pressure (Hypertension) in her maternal grandmother and mother; Kidney cancer in her maternal aunt; Mental retardation in her sister; No Known Problems in her father; Pulmonary embolism (age of onset: 22) in her maternal grandmother. Social History:  reports that she has never smoked. She has never used smokeless tobacco. She reports that she does not currently use alcohol. She reports that she does not use drugs. OB/GYN History:  OB History  Gravida 3   Para 1   Term 1   Preterm     AB 2   Living 1       SAB     IAB     Ectopic 2   Molar     Multiple     Live Births 1        Allergies: is allergic to propofol. Medications: Current Outpatient Medications:    esomeprazole (NEXIUM) 20 MG DR capsule, Take 40 mg by mouth once daily, Disp: , Rfl:    prenatal vitamin-iron-FA-DHA (PNV-DHA) 27 mg iron-1  mg -300 mg capsule, Take 1 capsule by mouth once daily, Disp: 30 capsule, Rfl: 1   dexlansoprazole (DEXILANT) 60 mg DR capsule, Take 60 mg by mouth once daily (Patient not taking: Reported on 10/01/2021), Disp: , Rfl:    docusate (COLACE) 100 MG capsule, Take 1 capsule (100 mg total) by mouth 2 (two) times daily (Patient not taking: Reported on 09/12/2019), Disp: 60 capsule, Rfl: 1   etonogestreL-ethinyl estradioL (NUVARING) 0.12-0.015 mg/24 hr vaginal ring, Place 1 ring vaginally monthly Leave in place for 3 consecutive weeks, then remove for 1 week. (Patient not taking: Reported on 02/24/2020), Disp: 1 ring, Rfl: 11   famotidine (PEPCID) 20 MG tablet, Take 20 mg by mouth 2 (two) times daily (Patient not taking: Reported on 02/24/2020), Disp: , Rfl:    ferrous sulfate 325 (65 FE) MG tablet, Take 325 mg by mouth daily with breakfast (Patient not taking: Reported on 09/12/2019), Disp: , Rfl:    letrozole (FEMARA) 2.5 mg tablet, TAKE 7 MILLIGRAMS ON DAYS 3-7 FOLLOWING PERIOD (Patient not taking: Reported on 10/01/2021), Disp: 45 tablet, Rfl: 1   OVIDREL 250 mcg/0.5 mL prefilled syringe injection, , Disp: , Rfl:    progesterone (PROMETRIUM) 200 MG capsule, 3 days after ovulation insert progesterone vaginally and continue once nightly through first trimester (Patient not taking: Reported on 10/01/2021), Disp: 30 capsule, Rfl: 11   Review of Systems: No SOB, no palpitations or chest pain, no new lower extremity edema, no nausea or vomiting or bowel or bladder complaints. See HPI for gyn specific ROS.    Exam:   BP 117/82   Pulse 86   Wt 84.3 kg (185 lb 12.8 oz)   LMP 09/24/2021 (Exact Date)   BMI 33.98 kg/m     Constitutional:  General appearance: Well nourished, well developed female in no acute distress.  Neuro/psych:  Normal mood and affect. No gross motor deficits. Neck:  Supple, normal appearance.  Respiratory:  Normal respiratory effort, no use of accessory muscles Skin:  No visible rashes or  external lesions    Impression:   The primary encounter diagnosis was Infertility management. Diagnoses of Endometriosis, Primary dysmenorrhea, and History of ectopic pregnancy were also pertinent to this visit.   Plan:   - Endometriosis, Hx of endometrioma, Encounter for investigation and management of infertility - Pain with periods has worsened since her last visit and she would like to make sure her endometriosis is well controlled prior to her next round of IUI which she is getting through [facility] in [place].  - After discussion of her options, we have decided to continue with surgical management. She feels prepared for this step and understands what to expect as she has had this surgery before.  - Will plan for dx laparoscopy with possible lysis of adhesions, excision of endometriosis, possible RO cystectomy and chromotubation  - We reviewed possible robot assist  - Consents were  signed today and all questions answered - Return for post op    Will plan to make incision in LUQ to avoid her bellybutton with hx of previous umbilical repair and likely scar tissue from prior surgeries

## 2021-10-14 ENCOUNTER — Encounter
Admission: RE | Admit: 2021-10-14 | Discharge: 2021-10-14 | Disposition: A | Discharge status: Home / Self Care | Payer: 59 | Source: Ambulatory Visit | Attending: Obstetrics and Gynecology | Admitting: Obstetrics and Gynecology

## 2021-10-14 ENCOUNTER — Encounter: Payer: Self-pay | Admitting: Urgent Care

## 2021-10-14 DIAGNOSIS — N979 Female infertility, unspecified: Secondary | ICD-10-CM | POA: Diagnosis not present

## 2021-10-14 DIAGNOSIS — N809 Endometriosis, unspecified: Secondary | ICD-10-CM | POA: Diagnosis not present

## 2021-10-14 DIAGNOSIS — Z01812 Encounter for preprocedural laboratory examination: Secondary | ICD-10-CM | POA: Diagnosis present

## 2021-10-14 DIAGNOSIS — Z319 Encounter for procreative management, unspecified: Secondary | ICD-10-CM

## 2021-10-14 LAB — BASIC METABOLIC PANEL
Anion gap: 8 (ref 5–15)
BUN: 9 mg/dL (ref 6–20)
CO2: 26 mmol/L (ref 22–32)
Calcium: 9.1 mg/dL (ref 8.9–10.3)
Chloride: 106 mmol/L (ref 98–111)
Creatinine, Ser: 0.62 mg/dL (ref 0.44–1.00)
GFR, Estimated: >60 mL/min (ref >60)
Glucose, Bld: 91 mg/dL (ref 70–99)
Potassium: 4 mmol/L (ref 3.5–5.1)
Sodium: 140 mmol/L (ref 135–145)

## 2021-10-14 LAB — TYPE AND SCREEN
ABO/RH(D): A POS
Antibody Screen: NEGATIVE

## 2021-10-14 LAB — CBC
HCT: 39.1 % (ref 36.0–46.0)
Hemoglobin: 12.8 g/dL (ref 12.0–15.0)
MCH: 27.4 pg (ref 26.0–34.0)
MCHC: 32.7 g/dL (ref 30.0–36.0)
MCV: 83.7 fL (ref 80.0–100.0)
Platelets: 345 10*3/uL (ref 150–400)
RBC: 4.67 MIL/uL (ref 3.87–5.11)
RDW: 14.4 % (ref 11.5–15.5)
WBC: 6.8 10*3/uL (ref 4.0–10.5)
nRBC: 0 % (ref 0.0–0.2)

## 2021-10-14 LAB — HCG, QUANTITATIVE, PREGNANCY: hCG, Beta Chain, Quant, S: <1 m[IU]/mL (ref <5)

## 2021-10-14 MED ORDER — LACTATED RINGERS IV SOLN: INTRAVENOUS | Status: DC

## 2021-10-14 MED ORDER — POVIDONE-IODINE 10 % EX SWAB: 2.0000 | Freq: Once | CUTANEOUS | Status: DC

## 2021-10-14 MED ORDER — CEFAZOLIN SODIUM-DEXTROSE 2-4 GM/100ML-% IV SOLN
2.0000 g | INTRAVENOUS | Status: AC
  Administered 2021-10-15: 2 g via INTRAVENOUS

## 2021-10-14 MED ORDER — CHLORHEXIDINE GLUCONATE 0.12 % MT SOLN: 15.0000 mL | Freq: Once | OROMUCOSAL | Status: AC

## 2021-10-14 MED ORDER — ACETAMINOPHEN 500 MG PO TABS: 1000.0000 mg | ORAL_TABLET | ORAL | Status: AC

## 2021-10-14 MED ORDER — GABAPENTIN 300 MG PO CAPS: 300.0000 mg | ORAL_CAPSULE | ORAL | Status: AC

## 2021-10-14 MED ORDER — ORAL CARE MOUTH RINSE: 15.0000 mL | Freq: Once | OROMUCOSAL | Status: AC

## 2021-10-15 ENCOUNTER — Other Ambulatory Visit: Payer: Self-pay

## 2021-10-15 ENCOUNTER — Encounter
Admission: RE | Disposition: A | Discharge status: Home / Self Care | Payer: Self-pay | Source: Home / Self Care | Attending: Obstetrics and Gynecology

## 2021-10-15 ENCOUNTER — Ambulatory Visit: Payer: 59 | Admitting: Urgent Care

## 2021-10-15 ENCOUNTER — Encounter: Payer: Self-pay | Admitting: Obstetrics and Gynecology

## 2021-10-15 ENCOUNTER — Ambulatory Visit
Admission: RE | Admit: 2021-10-15 | Discharge: 2021-10-15 | Disposition: A | Discharge status: Home / Self Care | Payer: 59 | Attending: Obstetrics and Gynecology | Admitting: Obstetrics and Gynecology

## 2021-10-15 DIAGNOSIS — Z01812 Encounter for preprocedural laboratory examination: Secondary | ICD-10-CM

## 2021-10-15 DIAGNOSIS — Z6833 Body mass index (BMI) 33.0-33.9, adult: Secondary | ICD-10-CM | POA: Insufficient documentation

## 2021-10-15 DIAGNOSIS — K219 Gastro-esophageal reflux disease without esophagitis: Secondary | ICD-10-CM | POA: Insufficient documentation

## 2021-10-15 DIAGNOSIS — N979 Female infertility, unspecified: Secondary | ICD-10-CM | POA: Diagnosis present

## 2021-10-15 DIAGNOSIS — Z319 Encounter for procreative management, unspecified: Secondary | ICD-10-CM

## 2021-10-15 DIAGNOSIS — Z90721 Acquired absence of ovaries, unilateral: Secondary | ICD-10-CM | POA: Insufficient documentation

## 2021-10-15 DIAGNOSIS — N80202 Endometriosis of left fallopian tube, unspecified depth: Secondary | ICD-10-CM | POA: Insufficient documentation

## 2021-10-15 DIAGNOSIS — N809 Endometriosis, unspecified: Secondary | ICD-10-CM

## 2021-10-15 DIAGNOSIS — N80329 Endometriosis of the posterior cul-de-sac, unspecified depth: Secondary | ICD-10-CM | POA: Diagnosis not present

## 2021-10-15 DIAGNOSIS — N803C2 Endometriosis of the left uterosacral ligament, unspecified depth: Secondary | ICD-10-CM | POA: Diagnosis not present

## 2021-10-15 DIAGNOSIS — R102 Pelvic and perineal pain: Secondary | ICD-10-CM

## 2021-10-15 HISTORY — PX: LAPAROSCOPY: SHX197

## 2021-10-15 HISTORY — PX: CHROMOPERTUBATION: SHX6288

## 2021-10-15 LAB — POCT PREGNANCY, URINE: Preg Test, Ur: NEGATIVE

## 2021-10-15 SURGERY — LAPAROSCOPY, DIAGNOSTIC
Anesthesia: General | Site: Pelvis

## 2021-10-15 MED ORDER — DEXAMETHASONE SODIUM PHOSPHATE 10 MG/ML IJ SOLN
INTRAMUSCULAR | Status: DC | PRN
  Administered 2021-10-15: 10 mg via INTRAVENOUS

## 2021-10-15 MED ORDER — FENTANYL CITRATE (PF) 100 MCG/2ML IJ SOLN
25.0000 ug | INTRAMUSCULAR | Status: AC | PRN
  Administered 2021-10-15 (×6): 25 ug via INTRAVENOUS

## 2021-10-15 MED ORDER — FENTANYL CITRATE (PF) 100 MCG/2ML IJ SOLN
INTRAMUSCULAR | Status: AC
  Administered 2021-10-15: 25 ug via INTRAVENOUS
  Filled 2021-10-15: qty 2

## 2021-10-15 MED ORDER — FENTANYL CITRATE (PF) 100 MCG/2ML IJ SOLN
INTRAMUSCULAR | Status: DC | PRN
  Administered 2021-10-15: 100 ug via INTRAVENOUS

## 2021-10-15 MED ORDER — GABAPENTIN 300 MG PO CAPS
ORAL_CAPSULE | ORAL | Status: AC
  Administered 2021-10-15: 300 mg via ORAL
  Filled 2021-10-15: qty 1

## 2021-10-15 MED ORDER — OXYCODONE HCL 5 MG PO TABS: 5.0000 mg | ORAL_TABLET | ORAL | 0 refills | Status: DC | PRN

## 2021-10-15 MED ORDER — FENTANYL CITRATE (PF) 100 MCG/2ML IJ SOLN
INTRAMUSCULAR | Status: AC
  Filled 2021-10-15: qty 2

## 2021-10-15 MED ORDER — CHLORHEXIDINE GLUCONATE 0.12 % MT SOLN
OROMUCOSAL | Status: AC
  Administered 2021-10-15: 15 mL via OROMUCOSAL
  Filled 2021-10-15: qty 15

## 2021-10-15 MED ORDER — KETAMINE HCL 50 MG/5ML IJ SOSY
PREFILLED_SYRINGE | INTRAMUSCULAR | Status: AC
  Filled 2021-10-15: qty 5

## 2021-10-15 MED ORDER — CEFAZOLIN SODIUM-DEXTROSE 2-4 GM/100ML-% IV SOLN
INTRAVENOUS | Status: AC
  Filled 2021-10-15: qty 100

## 2021-10-15 MED ORDER — ACETAMINOPHEN EXTRA STRENGTH 500 MG PO TABS: 1000.0000 mg | ORAL_TABLET | Freq: Four times a day (QID) | ORAL | 0 refills | Status: AC

## 2021-10-15 MED ORDER — METHYLENE BLUE 1 % INJ SOLN
INTRAVENOUS | Status: AC
  Filled 2021-10-15: qty 10

## 2021-10-15 MED ORDER — OXYCODONE HCL 5 MG PO TABS: 5.0000 mg | ORAL_TABLET | Freq: Once | ORAL | Status: AC

## 2021-10-15 MED ORDER — ONDANSETRON HCL 4 MG/2ML IJ SOLN
INTRAMUSCULAR | Status: DC | PRN
  Administered 2021-10-15: 4 mg via INTRAVENOUS

## 2021-10-15 MED ORDER — SILVER NITRATE-POT NITRATE 75-25 % EX MISC
CUTANEOUS | Status: AC
  Filled 2021-10-15: qty 10

## 2021-10-15 MED ORDER — PROPOFOL 10 MG/ML IV BOLUS
INTRAVENOUS | Status: DC | PRN
  Administered 2021-10-15: 200 mg via INTRAVENOUS

## 2021-10-15 MED ORDER — OXYCODONE HCL 5 MG PO TABS
ORAL_TABLET | ORAL | Status: AC
  Administered 2021-10-15: 5 mg via ORAL
  Filled 2021-10-15: qty 1

## 2021-10-15 MED ORDER — LIDOCAINE HCL (CARDIAC) PF 100 MG/5ML IV SOSY
PREFILLED_SYRINGE | INTRAVENOUS | Status: DC | PRN
  Administered 2021-10-15: 70 mg via INTRAVENOUS

## 2021-10-15 MED ORDER — ROCURONIUM BROMIDE 100 MG/10ML IV SOLN
INTRAVENOUS | Status: DC | PRN
  Administered 2021-10-15: 70 mg via INTRAVENOUS
  Administered 2021-10-15: 30 mg via INTRAVENOUS

## 2021-10-15 MED ORDER — SUGAMMADEX SODIUM 200 MG/2ML IV SOLN
INTRAVENOUS | Status: DC | PRN
  Administered 2021-10-15: 200 mg via INTRAVENOUS

## 2021-10-15 MED ORDER — ONDANSETRON HCL 4 MG/2ML IJ SOLN
4.0000 mg | Freq: Once | INTRAMUSCULAR | Status: AC | PRN
  Administered 2021-10-15: 4 mg via INTRAVENOUS

## 2021-10-15 MED ORDER — BUPIVACAINE HCL 0.5 % IJ SOLN
INTRAMUSCULAR | Status: DC | PRN
  Administered 2021-10-15: 12 mL

## 2021-10-15 MED ORDER — MIDAZOLAM HCL 2 MG/2ML IJ SOLN
INTRAMUSCULAR | Status: AC
  Filled 2021-10-15: qty 2

## 2021-10-15 MED ORDER — KETAMINE HCL 10 MG/ML IJ SOLN
INTRAMUSCULAR | Status: DC | PRN
  Administered 2021-10-15: 50 mg via INTRAVENOUS

## 2021-10-15 MED ORDER — GABAPENTIN 800 MG PO TABS: 800.0000 mg | ORAL_TABLET | Freq: Every day | ORAL | 0 refills | Status: DC

## 2021-10-15 MED ORDER — IBUPROFEN 800 MG PO TABS: 800.0000 mg | ORAL_TABLET | Freq: Three times a day (TID) | ORAL | 1 refills | Status: AC

## 2021-10-15 MED ORDER — ONDANSETRON HCL 4 MG/2ML IJ SOLN
INTRAMUSCULAR | Status: AC
  Filled 2021-10-15: qty 2

## 2021-10-15 MED ORDER — LACTATED RINGERS IV SOLN: INTRAVENOUS | Status: DC | PRN

## 2021-10-15 MED ORDER — BUPIVACAINE HCL (PF) 0.5 % IJ SOLN
INTRAMUSCULAR | Status: AC
  Filled 2021-10-15: qty 30

## 2021-10-15 MED ORDER — 0.9 % SODIUM CHLORIDE (POUR BTL) OPTIME
TOPICAL | Status: DC | PRN
  Administered 2021-10-15: 500 mL

## 2021-10-15 MED ORDER — MIDAZOLAM HCL 2 MG/2ML IJ SOLN
INTRAMUSCULAR | Status: DC | PRN
  Administered 2021-10-15: 2 mg via INTRAVENOUS

## 2021-10-15 MED ORDER — ACETAMINOPHEN 500 MG PO TABS
ORAL_TABLET | ORAL | Status: AC
  Administered 2021-10-15: 1000 mg via ORAL
  Filled 2021-10-15: qty 2

## 2021-10-15 MED ORDER — DOCUSATE SODIUM 100 MG PO CAPS: 100.0000 mg | ORAL_CAPSULE | Freq: Two times a day (BID) | ORAL | 0 refills | Status: DC

## 2021-10-15 SURGICAL SUPPLY — 62 items
ADH SKN CLS APL DERMABOND .7 (GAUZE/BANDAGES/DRESSINGS) ×3
APL PRP STRL LF DISP 70% ISPRP (MISCELLANEOUS) ×3
APL SRG 38 LTWT LNG FL B (MISCELLANEOUS) ×3
APPLICATOR ARISTA FLEXITIP XL (MISCELLANEOUS) ×3 IMPLANT
BAG DRN RND TRDRP ANRFLXCHMBR (UROLOGICAL SUPPLIES) ×3
BAG URINE DRAIN 2000ML AR STRL (UROLOGICAL SUPPLIES) ×3 IMPLANT
BLADE SURG SZ11 CARB STEEL (BLADE) ×3 IMPLANT
CATH FOLEY 2WAY  5CC 16FR (CATHETERS) ×3
CATH FOLEY 2WAY 5CC 16FR (CATHETERS) ×3
CATH URTH 16FR FL 2W BLN LF (CATHETERS) ×3 IMPLANT
CHLORAPREP W/TINT 26 (MISCELLANEOUS) ×3 IMPLANT
CORD MONOPOLAR M/FML 12FT (MISCELLANEOUS) ×3 IMPLANT
DERMABOND ADVANCED .7 DNX12 (GAUZE/BANDAGES/DRESSINGS) ×3 IMPLANT
DRAPE STERI POUCH LG 24X46 STR (DRAPES) IMPLANT
GAUZE 4X4 16PLY ~~LOC~~+RFID DBL (SPONGE) ×3 IMPLANT
GLOVE BIO SURGEON STRL SZ7 (GLOVE) ×6 IMPLANT
GLOVE SURG SYN 8.0 (GLOVE) ×3 IMPLANT
GLOVE SURG SYN 8.0 PF PI (GLOVE) ×1 IMPLANT
GLOVE SURG UNDER LTX SZ7.5 (GLOVE) ×3 IMPLANT
GOWN STRL REUS W/ TWL LRG LVL3 (GOWN DISPOSABLE) ×6 IMPLANT
GOWN STRL REUS W/ TWL XL LVL3 (GOWN DISPOSABLE) ×3 IMPLANT
GOWN STRL REUS W/TWL LRG LVL3 (GOWN DISPOSABLE) ×6
GOWN STRL REUS W/TWL XL LVL3 (GOWN DISPOSABLE) ×3
HEMOSTAT ARISTA ABSORB 3G PWDR (HEMOSTASIS) IMPLANT
IRRIGATION STRYKERFLOW (MISCELLANEOUS) IMPLANT
IRRIGATOR STRYKERFLOW (MISCELLANEOUS)
IV NS 1000ML (IV SOLUTION) ×3
IV NS 1000ML BAXH (IV SOLUTION) ×3 IMPLANT
KIT PINK PAD W/HEAD ARE REST (MISCELLANEOUS) ×3
KIT PINK PAD W/HEAD ARM REST (MISCELLANEOUS) ×3 IMPLANT
KIT TURNOVER CYSTO (KITS) ×3 IMPLANT
L-HOOK LAP DISP 36CM (ELECTROSURGICAL)
LABEL OR SOLS (LABEL) ×3 IMPLANT
LHOOK LAP DISP 36CM (ELECTROSURGICAL) IMPLANT
LIGASURE VESSEL 5MM BLUNT TIP (ELECTROSURGICAL) IMPLANT
MANIFOLD NEPTUNE II (INSTRUMENTS) ×3 IMPLANT
NS IRRIG 500ML POUR BTL (IV SOLUTION) ×3 IMPLANT
PACK GYN LAPAROSCOPIC (MISCELLANEOUS) ×3 IMPLANT
PAD OB MATERNITY 4.3X12.25 (PERSONAL CARE ITEMS) ×3 IMPLANT
PAD PREP 24X41 OB/GYN DISP (PERSONAL CARE ITEMS) ×3 IMPLANT
SCISSORS METZENBAUM CVD 33 (INSTRUMENTS) ×2 IMPLANT
SCRUB CHG 4% DYNA-HEX 4OZ (MISCELLANEOUS) ×3 IMPLANT
SET TUBE SMOKE EVAC HIGH FLOW (TUBING) ×3 IMPLANT
SLEEVE Z-THREAD 5X100MM (TROCAR) ×5 IMPLANT
STRIP CLOSURE SKIN 1/4X4 (GAUZE/BANDAGES/DRESSINGS) IMPLANT
SUT MNCRL 4-0 (SUTURE) ×3
SUT MNCRL 4-0 27XMFL (SUTURE) ×3
SUT MNCRL AB 4-0 PS2 18 (SUTURE) ×3 IMPLANT
SUT VIC AB 2-0 UR6 27 (SUTURE) ×3 IMPLANT
SUT VIC AB 4-0 SH 27 (SUTURE) ×3
SUT VIC AB 4-0 SH 27XANBCTRL (SUTURE) ×3 IMPLANT
SUTURE MNCRL 4-0 27XMF (SUTURE) ×2 IMPLANT
SYR 50ML LL SCALE MARK (SYRINGE) IMPLANT
SYR 5ML LL (SYRINGE) IMPLANT
SYS BAG RETRIEVAL 10MM (BASKET)
SYS KII FIOS ACCESS ABD 5X100 (TROCAR) ×6
SYSTEM BAG RETRIEVAL 10MM (BASKET) IMPLANT
SYSTEM KII FIOS ACES ABD 5X100 (TROCAR) ×4 IMPLANT
TRAP FLUID SMOKE EVACUATOR (MISCELLANEOUS) ×3 IMPLANT
TROCAR XCEL NON-BLD 5MMX100MML (ENDOMECHANICALS) ×3 IMPLANT
TUBING ART PRESS 48 MALE/FEM (TUBING) IMPLANT
WATER STERILE IRR 500ML POUR (IV SOLUTION) ×3 IMPLANT

## 2021-10-15 NOTE — Interval H&P Note (Signed)
History and Physical Interval Note:  10/15/2021 11:16 AM  Suzanne Cross  has presented today for surgery, with the diagnosis of chronic pelvic pain, infertility.  The various methods of treatment have been discussed with the patient and family. After consideration of risks, benefits and other options for treatment, the patient has consented to  Procedure(s): LAPAROSCOPY DIAGNOSTIC, POSSIBLE EXCISION OF ENDOMETRIOSIS (N/A) OVARIAN CYSTECTOMY (Right) LYSIS OF ADHESION (N/A) CHROMOPERTUBATION (N/A) as a surgical intervention.  The patient's history has been reviewed, patient examined, no change in status, stable for surgery.  I have reviewed the patient's chart and labs.  Questions were answered to the patient's satisfaction.     Benjaman Kindler

## 2021-10-15 NOTE — Anesthesia Preprocedure Evaluation (Signed)
Anesthesia Evaluation  Patient identified by MRN, date of birth, ID band Patient awake    Reviewed: Allergy & Precautions, NPO status , Patient's Chart, lab work & pertinent test results  Airway Mallampati: II  TM Distance: >3 FB Neck ROM: full    Dental  (+) Teeth Intact   Pulmonary neg pulmonary ROS,    Pulmonary exam normal breath sounds clear to auscultation       Cardiovascular Exercise Tolerance: Good negative cardio ROS Normal cardiovascular exam Rhythm:Regular     Neuro/Psych Depression negative neurological ROS  negative psych ROS   GI/Hepatic negative GI ROS, Neg liver ROS, GERD  Medicated,  Endo/Other  negative endocrine ROSMorbid obesity  Renal/GU negative Renal ROS  negative genitourinary   Musculoskeletal   Abdominal (+) + obese,   Peds negative pediatric ROS (+)  Hematology negative hematology ROS (+) Blood dyscrasia, anemia ,   Anesthesia Other Findings Past Medical History: No date: Acid reflux disease No date: Anemia No date: Chocolate cyst of ovary     Comment:  Left No date: Complication of anesthesia     Comment:  LOW bp possible from propofol 2019, 2018: Ectopic pregnancy No date: Endometriosis No date: PONV (postoperative nausea and vomiting) No date: Scoliosis  Past Surgical History: 07/06/2018: CESAREAN SECTION; N/A     Comment:  Procedure: CESAREAN SECTION;  Surgeon: Maceo Pro,               MD;  Location: [facility] ORS;  Service: Obstetrics;                Laterality: N/A; 10/19/2020: COLONOSCOPY WITH PROPOFOL; N/A     Comment:  Procedure: COLONOSCOPY WITH PROPOFOL;  Surgeon: [name], MD;  Location: [facility] ENDOSCOPY;  Service:               Gastroenterology;  Laterality: N/A; 06/02/2017: DIAGNOSTIC LAPAROSCOPY WITH REMOVAL OF ECTOPIC PREGNANCY;  Left     Comment:  Procedure: DIAGNOSTIC LAPAROSCOPY WITH REMOVAL OF               ECTOPIC PREGNANCY;  Surgeon: Ward,  Honor Loh, MD;                Location: [facility] ORS;  Service: Gynecology;  Laterality:               Left; 02/2016: ECTOPIC PREGNANCY SURGERY 10/19/2020: ESOPHAGOGASTRODUODENOSCOPY; N/A     Comment:  Procedure: ESOPHAGOGASTRODUODENOSCOPY (EGD);  Surgeon:               [name], MD;  Location: [facility] ENDOSCOPY;  Service:               Gastroenterology;  Laterality: N/A;  Patient is allergic               to Propofol 02/05/2021: HYSTEROSCOPY; N/A     Comment:  Procedure: Hysteroscopy ;  Surgeon: Benjaman Kindler,               MD;  Location: [facility] ORS;  Service: Gynecology;                Laterality: N/A; 06/02/2017: LAPAROTOMY; N/A     Comment:  Procedure: EXPLORATORY LAPAROTOMY;  Surgeon: Ward,               Honor Loh, MD;  Location: [facility] ORS;  Service: Gynecology;  Laterality: N/A; 09/2015: LAPAROTOMY; Left 08/2016: LAPAROTOMY; Left  BMI    Body Mass Index: 33.84 kg/m      Reproductive/Obstetrics negative OB ROS                             Anesthesia Physical Anesthesia Plan  ASA: 2  Anesthesia Plan: General   Post-op Pain Management:    Induction: Intravenous  PONV Risk Score and Plan: 1 and Ondansetron and Dexamethasone  Airway Management Planned: Oral ETT  Additional Equipment:   Intra-op Plan:   Post-operative Plan: Extubation in OR  Informed Consent: I have reviewed the patients History and Physical, chart, labs and discussed the procedure including the risks, benefits and alternatives for the proposed anesthesia with the patient or authorized representative who has indicated his/her understanding and acceptance.     Dental Advisory Given  Plan Discussed with: CRNA and Surgeon  Anesthesia Plan Comments:         Anesthesia Quick Evaluation

## 2021-10-15 NOTE — Discharge Instructions (Addendum)
Laparoscopic Ovarian Surgery Discharge Instructions  For the next three days, take ibuprofen and acetaminophen on a schedule, every 8 hours. You can take them together or you can intersperse them, and take one every four hours. I also gave you gabapentin for nighttime, to help you sleep and also to control pain. Take gabapentin medicines at night for at least the next 3 nights. You also have a narcotic, oxycodone, to take as needed if the above medicines don't help.  Postop constipation is a major cause of pain. Stay well hydrated, walk as you tolerate, and take over the counter senna as well as stool softeners if you need them.  There will be blue discharge from the chromopertubation. This is expected and normal.  RISKS AND COMPLICATIONS  Infection. Bleeding. Injury to surrounding organs. Anesthetic side effects.   PROCEDURE  You may be given a medicine to help you relax (sedative) before the procedure. You will be given a medicine to make you sleep (general anesthetic) during the procedure. A tube will be put down your throat to help your breath while under general anesthesia. Several small cuts (incisions) are made in the lower abdominal area and one incision is made near the belly button. Your abdominal area will be inflated with a safe gas (carbon dioxide). This helps give the surgeon room to operate, visualize, and helps the surgeon avoid other organs. A thin, lighted tube (laparoscope) with a camera attached is inserted into your abdomen through the incision near the belly button. Other small instruments may also be inserted through other abdominal incisions. The ovary is located and are removed. After the ovary is removed, the gas is released from the abdomen. The incisions will be closed with stitches (sutures), and Dermabond. A bandage may be placed over the incisions.  AFTER THE PROCEDURE  You will also have some mild abdominal discomfort for 3-7 days. You will be given pain  medicine to ease any discomfort. As long as there are no problems, you may be allowed to go home. Someone will need to drive you home and be with you for at least 24 hours once home. You may have some mild discomfort in the throat. This is from the tube placed in your throat while you were sleeping. You may experience discomfort in the shoulder area from some trapped air between the liver and diaphragm. This sensation is normal and will slowly go away on its own.  HOME CARE INSTRUCTIONS  Take all medicines as directed. Only take over-the-counter or prescription medicines for pain, discomfort, or fever as directed by your caregiver. Resume daily activities as directed. Showers are preferred over baths for 2 weeks. You may resume sexual activities in 1 week or as you feel you would like to. Do not drive while taking narcotics.  SEEK MEDICAL CARE IF: . There is increasing abdominal pain. You feel lightheaded or faint. You have the chills. You have an oral temperature above 102 F (38.9 C). There is pus-like (purulent) drainage from any of the wounds. You are unable to pass gas or have a bowel movement. You feel sick to your stomach (nauseous) or throw up (vomit) and can't control it with your medicines.  MAKE SURE YOU:  Understand these instructions. Will watch your condition. Will get help right away if you are not doing well or get worse.  ExitCare Patient Information 2013 [place].   AMBULATORY SURGERY  DISCHARGE INSTRUCTIONS   The drugs that you were given will stay in your system until  tomorrow so for the next 24 hours you should not:  Drive an automobile Make any legal decisions Drink any alcoholic beverage   You may resume regular meals tomorrow.  Today it is better to start with liquids and gradually work up to solid foods.  You may eat anything you prefer, but it is better to start with liquids, then soup and crackers, and gradually work up to solid  foods.   Please notify your doctor immediately if you have any unusual bleeding, trouble breathing, redness and pain at the surgery site, drainage, fever, or pain not relieved by medication.    Additional Instructions:   Please contact your physician with any problems or Same Day Surgery at [phone], Monday through Friday 6 am to 4 pm, or [place] at [facility] number at [phone].

## 2021-10-15 NOTE — Transfer of Care (Signed)
Immediate Anesthesia Transfer of Care Note  Patient: Suzanne Cross  Procedure(s) Performed: LAPAROSCOPY DIAGNOSTIC, POSSIBLE EXCISION OF ENDOMETRIOSIS CHROMOPERTUBATION  Patient Location: PACU  Anesthesia Type:General  Level of Consciousness: drowsy  Airway & Oxygen Therapy: Patient Spontanous Breathing and Patient connected to face mask oxygen  Post-op Assessment: vss report given to RN  Post vital signs: Reviewed and stable  Last Vitals:  Vitals Value Taken Time  BP 115/71 10/15/21 1340  Temp 36.4 C 10/15/21 1328  Pulse 81 10/15/21 1344  Resp 19 10/15/21 1344  SpO2 100 % 10/15/21 1344  Vitals shown include unvalidated device data.  Last Pain:  Vitals:   10/15/21 1328  TempSrc:   PainSc: Asleep         Complications: No notable events documented.

## 2021-10-15 NOTE — Anesthesia Procedure Notes (Signed)
Procedure Name: Intubation Date/Time: 10/15/2021 12:01 PM  Performed by: Beverely Low, CRNAPre-anesthesia Checklist: Patient identified, Patient being monitored, Timeout performed, Emergency Drugs available and Suction available Patient Re-evaluated:Patient Re-evaluated prior to induction Oxygen Delivery Method: Circle system utilized Preoxygenation: Pre-oxygenation with 100% oxygen Induction Type: IV induction Ventilation: Mask ventilation without difficulty Laryngoscope Size: 3 and McGraph Grade View: Grade I Tube type: Oral Tube size: 7.5 mm Number of attempts: 1 Airway Equipment and Method: Stylet Placement Confirmation: ETT inserted through vocal cords under direct vision, positive ETCO2 and breath sounds checked- equal and bilateral Secured at: 21 cm Tube secured with: Tape Dental Injury: Teeth and Oropharynx as per pre-operative assessment

## 2021-10-15 NOTE — Op Note (Addendum)
Rudell Cobb PROCEDURE DATE: 10/15/2021  PREOPERATIVE DIAGNOSIS: Infertility, hx of known endometriosis, surgical absence of left tube and ovary POSTOPERATIVE DIAGNOSIS: Endometriosis, patent right fallopian tube PROCEDURE:  LAPAROSCOPY operative, EXCISION OF ENDOMETRIOSIS: 49320 (CPT) CHROMOPERTUBATION: 55732 (CPT) LYSIS OF ADHESIONS  SURGEON:  Dr. Benjaman Kindler, MD ASSISTANT: Dr. Prentice Docker, MD  ANESTHESIOLOGIST: Anesthesiologist: [place] [facility], Jane Canary, MD CRNA: Tollie Eth, CRNA; Beverely Low, CRNA  INDICATIONS: 31 y.o. [phone] with history of infertility, surgical absence of left tube and ovary and hx of ectopic pregnancy, desiring surgical evaluation and management prior to next cycle of IUI.   Please see preoperative notes for further details.    Risks of surgery were discussed with the patient including but not limited to: bleeding which may require transfusion or reoperation; infection which may require antibiotics; injury to bowel, bladder, ureters or other surrounding organs; need for additional procedures including laparotomy; thromboembolic phenomenon, incisional problems and other postoperative/anesthesia complications. Written informed consent was obtained.    FINDINGS: Bulky uterus with much of the bogginess posteriorly.  Surgical absence of left tube and ovary.  Right tube and ovary with small right luteal cyst and no evidence of endometrioma.  Patent right tube.    Allen-Masters windows noted in the left ovarian fossa, the right ovarian fossa, left anterior pelvis lateral to the bladder.  Small clear blebs noted in the left ovarian fossa and posterior cul-de-sac.  These were excised.  Omental adhesions noted in the midline, these were mild and easily removed.  ANESTHESIA:    General INTRAVENOUS FLUIDS: 1100 ml ESTIMATED BLOOD LOSS: 10 ml URINE OUTPUT: 500 ml SPECIMENS: Peritoneal biopsies from cul de sac and uterosacral ligament COMPLICATIONS: None  immediate  PROCEDURE IN DETAIL:  The patient had sequential compression devices applied to her lower extremities while in the preoperative area.  She was then taken to the operating room where general anesthesia was administered and was found to be adequate.  She was placed in the dorsal lithotomy position, and was prepped and draped in a sterile manner.  After an adequate timeout was performed, a Foley catheter was inserted into her bladder and attached to constant drainage. A uterine manipulator (acorn) was then advanced into the uterus. This was attached to methylene blue/NS.   Attention was turned to the abdomen where a left upper quadrant 5 mm incision was made at Palmer's point.  Minimal scar tissue noted with the omentum in the midline, which was taken down with sharp and cautery scissors.    An umbilical incision was made and a Optiview 5-mm trocar and sleeve were then advanced without difficulty with the laparoscope under direct visualization into the abdomen.  The abdomen was then insufflated with carbon dioxide gas and adequate pneumoperitoneum was obtained.   A detailed survey of the patient's pelvis and abdomen revealed the findings as mentioned above.  Lap scissors were used to excise endometriosis and take peritoneal biopsies. Fulguration of endo lesions through the pelvis was performed, being careful to avoid all vascular structures. The ureters were visualized throughout the surgery.  The operative site was surveyed, and it was found to be hemostatic after cautery and pressure dropped.  No intraoperative injury to surrounding organs was noted.  Pictures were taken of the quadrants and pelvis. The abdomen was desufflated and all instruments were then removed from the patient's abdomen. The uterine manipulator was removed without complications.  All incisions were closed with 4-0 Vicryl and Dermabond.   The patient tolerated the procedures well.  All  instruments, needles, and sponge counts  were correct x 2. The patient was taken to the recovery room in stable condition.

## 2021-10-18 ENCOUNTER — Encounter: Payer: Self-pay | Admitting: Obstetrics and Gynecology

## 2021-10-18 LAB — SURGICAL PATHOLOGY

## 2021-10-18 NOTE — Anesthesia Postprocedure Evaluation (Signed)
Anesthesia Post Note  Patient: Suzanne Cross  Procedure(s) Performed: DIAGNOSTIC LAPAROSCOPY WITH EXCISION OF ENDOMETRIOSIS (Pelvis) CHROMOPERTUBATION (Pelvis)  Patient location during evaluation: PACU Anesthesia Type: General Level of consciousness: awake and oriented Pain management: satisfactory to patient Vital Signs Assessment: post-procedure vital signs reviewed and stable Respiratory status: nonlabored ventilation Anesthetic complications: no   No notable events documented.   Last Vitals:  Vitals:   10/15/21 1444 10/15/21 1523  BP: 117/76 110/65  Pulse: 89 85  Resp: 16 15  Temp: (!) 36.3 C   SpO2: 100% 99%    Last Pain:  Vitals:   10/15/21 1523  TempSrc:   PainSc: 4                  VAN STAVEREN,Niklaus Mamaril

## 2022-04-15 DIAGNOSIS — R102 Pelvic and perineal pain: Secondary | ICD-10-CM | POA: Diagnosis not present

## 2022-04-15 DIAGNOSIS — M549 Dorsalgia, unspecified: Secondary | ICD-10-CM | POA: Diagnosis not present

## 2022-04-25 DIAGNOSIS — O3680X0 Pregnancy with inconclusive fetal viability, not applicable or unspecified: Secondary | ICD-10-CM | POA: Diagnosis not present

## 2022-04-25 DIAGNOSIS — Z8279 Family history of other congenital malformations, deformations and chromosomal abnormalities: Secondary | ICD-10-CM | POA: Diagnosis not present

## 2022-04-27 ENCOUNTER — Encounter: Payer: Self-pay | Admitting: Certified Nurse Midwife

## 2022-04-27 ENCOUNTER — Other Ambulatory Visit: Payer: Self-pay | Admitting: Certified Nurse Midwife

## 2022-04-27 DIAGNOSIS — Z369 Encounter for antenatal screening, unspecified: Secondary | ICD-10-CM

## 2022-05-25 ENCOUNTER — Other Ambulatory Visit: Payer: Self-pay

## 2022-05-25 DIAGNOSIS — O219 Vomiting of pregnancy, unspecified: Secondary | ICD-10-CM | POA: Diagnosis not present

## 2022-05-30 ENCOUNTER — Ambulatory Visit: Payer: Self-pay | Admitting: Obstetrics

## 2022-05-30 ENCOUNTER — Ambulatory Visit (HOSPITAL_BASED_OUTPATIENT_CLINIC_OR_DEPARTMENT_OTHER): Payer: 59 | Admitting: Obstetrics

## 2022-05-30 ENCOUNTER — Ambulatory Visit: Payer: 59 | Attending: Obstetrics

## 2022-05-30 ENCOUNTER — Ambulatory Visit (HOSPITAL_BASED_OUTPATIENT_CLINIC_OR_DEPARTMENT_OTHER): Payer: 59

## 2022-05-30 ENCOUNTER — Other Ambulatory Visit: Payer: Self-pay

## 2022-05-30 DIAGNOSIS — E669 Obesity, unspecified: Secondary | ICD-10-CM

## 2022-05-30 DIAGNOSIS — O0901 Supervision of pregnancy with history of infertility, first trimester: Secondary | ICD-10-CM | POA: Insufficient documentation

## 2022-05-30 DIAGNOSIS — N809 Endometriosis, unspecified: Secondary | ICD-10-CM

## 2022-05-30 DIAGNOSIS — O34591 Maternal care for other abnormalities of gravid uterus, first trimester: Secondary | ICD-10-CM

## 2022-05-30 DIAGNOSIS — Z8279 Family history of other congenital malformations, deformations and chromosomal abnormalities: Secondary | ICD-10-CM

## 2022-05-30 DIAGNOSIS — Z3A12 12 weeks gestation of pregnancy: Secondary | ICD-10-CM | POA: Diagnosis not present

## 2022-05-30 DIAGNOSIS — O34219 Maternal care for unspecified type scar from previous cesarean delivery: Secondary | ICD-10-CM | POA: Diagnosis not present

## 2022-05-30 DIAGNOSIS — Z369 Encounter for antenatal screening, unspecified: Secondary | ICD-10-CM | POA: Insufficient documentation

## 2022-05-30 DIAGNOSIS — O99211 Obesity complicating pregnancy, first trimester: Secondary | ICD-10-CM | POA: Diagnosis not present

## 2022-05-30 NOTE — Progress Notes (Signed)
Referring Provider:  [facility] Length of Consultation: 40 minutes   Ms. Turley was referred to Maternal Fetal Care at [facility] for genetic counseling to discuss her family history and prenatal testing options.  This note is a summary of the discussion with she and her partner.  Family history: We first obtained a detailed family history. Ms. Kyer reported that her maternal half sister has Down syndrome. This sister was born when her mother was 2 years old and does not live with our patient or her mother's family. She is reported to have the characteristic facial features associated with Down syndrome as well as delayed motor and cognitive milestones.  We do not have documentation of her diagnosis, so we explained the our conversation about Down syndrome would not be accurate if the diagnosis is something different. Deltha also reported mental health diagnoses in several female relatives in her family. We reviewed that mental health conditions are likely to have strong inherited factors in some families and that she should be mindful of this history as well as her own personal history with postpartum depression after delivery.  Her partner reported a maternal half brother with Asperger syndrome.  Autism Spectrum Disorder affects approximately 1-2% of the general population in the [place], [place], and [place]. Autism is a neurological and developmental disorder that affects how people interact with others, communicate, learn, and behave. Autism is known as a "spectrum" disorder because there is wide variation in the type and severity of symptoms people experience. Genetic testing for individuals with a clinical diagnosis of autism yields an explanation in only about 20% of cases, and the remaining 80% of cases are left with unknown etiology. In the absence of a known genetic cause in the affected family member, we are unable to test directly for autism in pregnancy. We did review  the option of carrier testing for Fragile X syndrome, the most common inherited cause for intellectual delays which can have autism as a feature, though this does not clinically seem consistent with his brother.  Lastly, the couple stated that his paternal niece has been diagnosed with hidradentitis suppurtiva, a skin condition that results in small, painful lumps under the skin due to blocked hair follicles.  The cause for the condition is not understood, but some genetic components cannot be excluded.  The remainder of the family  history is unremarkable for birth defects, developmental delays, multiple miscarriages or chromosome conditions.  Pregnancy history: This is the fourth pregnancy for this couple.  The experienced two ectopic pregnancies followed by the birth of their daughter.  She is almost 32 years old and in good health, with normal growth and development (though she is on the small side of the growth curves). Ms. Christner reported no complications in the pregnancy and no exposure to medications, alcohol, tobacco or recreational drugs.  Down syndrome: We discussed that chromosomes are the inherited structures that contain our instructions for development (genes).  Each cell of our body normally has 46 chromosomes, matched up into 23 pairs.  The last pair determines our gender and are called the sex chromosomes.  A female has an X and a Y chromosome, while a female has two X chromosomes.  Rarely, when a mother's egg and father's sperm unite, an extra or missing chromosome can be passed on to the baby by mistake.  Changes in the number or the structure of the chromosomes may result in a child with some degree of intellectual delays and physical  problems.  Down syndrome is caused by having three copies (instead of the usual two copies) of the genes on chromosome number 21.  There are two types of Down syndrome.  Most often (about 95% of the time), Down syndrome is caused by an entire third copy of  chromosome 21, known as Trisomy 76.  In the other cases, Down syndrome is caused by a rearrangement of the chromosomes, known as a translocation.    Because we do not have documentation of the type of Down syndrome, we discussed the recurrence of both types.  If it is the more common type, it is thought to be a sporadic event that would not be likely to happen again in the family and should not increase the chance for Ms. Carbon to have a child with Down syndrome.  If her half sister has the translocation type of Down syndrome, the recurrence risk may be higher depending upon which, if any, family members may be translocation carriers.   We discussed the following prenatal screening and testing options for this pregnancy:  Cell free fetal DNA testing is a blood test to determine whether or not the baby may have either Down syndrome, trisomy 13, or trisomy 103.  This test utilizes a maternal blood sample and DNA sequencing technology to isolate circulating cell free fetal DNA from maternal plasma.  The fetal DNA can then be analyzed for DNA sequences that are derived from the three most common chromosomes involved in aneuploidy, chromosomes 13, 18, and 21.  If the overall amount of DNA is greater than the expected level for any of these chromosomes, aneuploidy is suspected.  While we do not consider it a replacement for invasive testing and karyotype analysis, a negative result from this testing would be reassuring, though not a guarantee of a normal chromosome complement for the baby.  An abnormal result is certainly suggestive of an abnormal chromosome complement, though we would still recommend CVS or amniocentesis to confirm any findings from this testing. Sex chromosome aneuploidy can also be included in this analysis.  Maternal serum marker screening, a blood test that measures pregnancy proteins, can provide risk assessments for Down syndrome, trisomy 18, and open neural tube defects (spina bifida,  anencephaly). Because it does not directly examine the fetus, it cannot positively diagnose or rule out these problems.  It is able to detect approximately 75% of babies with Down syndrome, 60% of babies with trisomy 18, and 80% of babies with spina bifida. If prior aneuploidy screening has been performed, then AFP only screening for open neural tube defects should be made available.  Targeted ultrasound uses high frequency sound waves to create an image of the developing fetus.  An ultrasound is often recommended as a routine means of evaluating the pregnancy.  It is also used to screen for fetal anatomy problems (for example, a heart defect) that might be suggestive of a chromosomal or other abnormality.   The chorionic villus sampling procedure is a diagnostic test available for first trimester chromosome analysis.  This involves the withdrawal of a small amount of chorionic villi (tissue from the developing placenta).  Risk of pregnancy loss is estimated to be approximately 1 in 200 to 1 in 500.  There is approximately a 1% (1 in 100) chance that the CVS chromosome results will be unclear.  Chorionic villi cannot be tested for neural tube defects.     Amniocentesis involves the removal of a small amount of amniotic fluid from the sac  surrounding the fetus ("bag of water") with the use of a thin needle inserted through the mother's abdomen and uterus.  Ultrasound guidance is used throughout the procedure.  Fetal cells from amniotic fluid are directly evaluated and > 99.5% of chromosome problems and > 98% of open neural tube defects can be detected. This procedure is generally performed after the 15th week of pregnancy.  The main risks to this procedure include complications leading to miscarriage in less than 1 in 500 cases.   Carrier screening: Cystic Fibrosis and Spinal Muscular Atrophy (SMA) screening were also discussed with the patient. Both conditions are recessive, which means that both parents  must be carriers in order to have a child with the disease.  Cystic fibrosis (CF) is one of the most common genetic conditions in persons of Caucasian ancestry.  This condition occurs in approximately 1 in 2,500 Caucasian persons and results in thickened secretions in the lungs, digestive, and reproductive systems.  For a baby to be at risk for having CF, both of the parents must be carriers for this condition.  Approximately 1 in 9 Caucasian persons is a carrier for CF.  Current carrier testing looks for the most common mutations in the gene for CF and can detect approximately 90% of carriers in the Caucasian population.  This means that the carrier screening can greatly reduce, but cannot eliminate, the chance for an individual to have a child with CF.  If an individual is found to be a carrier for CF, then carrier testing would be available for the partner. As part of [place]'s newborn screening profile, all babies born in the state of [place] will have a two-tier screening process.  Specimens are first tested to determine the concentration of immunoreactive trypsinogen (IRT).  The top 5% of specimens with the highest IRT values then undergo DNA testing using a panel of over 40 common CF mutations. SMA is a neurodegenerative disorder that leads to atrophy of skeletal muscle and overall weakness.  This condition is also more prevalent in the Caucasian population, with 1 in 40-1 in 60 persons being a carrier and 1 in 6,000-1 in 10,000 children being affected.  There are multiple forms of the disease, with some causing death in infancy to other forms with survival into adulthood.  The genetics of SMA is complex, but carrier screening can detect up to 95% of carriers in the Caucasian population.  Similar to CF, a negative result can greatly reduce, but cannot eliminate, the chance to have a child with SMA. Carrier screening for hemoglobinopathies was also made available to the patient. Hemoglobinopathy  screening was also made available.  Ms. Kliewer has an MCV of >80.  Plan of Care: We discussed the prenatal options in detail and after consideration, the patient elected to proceed with cell free DNA testing through [facility]. Results should be available within 2 weeks and will be called to the patient. The couple declined carrier screening for CF, SMA and hemoglobinopathies and are aware that newborn screening is available. Ms. Dewalt was scheduled to return for a detailed anatomy ultrasound after [redacted] weeks gestation.  We appreciate very much the opportunity to be involved with the care of this patient.  If the patient has further questions or concerns, please do not hesitate to call us at [phone].  Cherly Anderson, MS, CGC

## 2022-05-30 NOTE — Progress Notes (Signed)
MFM Note  Suzanne Cross was seen for a first trimester ultrasound due to maternal obesity.  The patient has a history of infertility.  She underwent a laparoscopy for treatment of endometriosis and chromopertubation at the end of last year.  This is a spontaneously conceived pregnancy.  A viable singleton intrauterine gestation was noted on today's exam.  The crown-rump length measured today is consistent with her gestational age, confirming her an [facility] of December 06, 2022.  The nuchal translucency measurement was 1.9 mm (within normal limits).  The patient will have a cell free DNA test [name] Panorama) drawn following today's ultrasound exam.  Our genetic counselor will notify the patient regarding the results of this test.    Due to maternal obesity, a detailed fetal anatomy scan has been scheduled in our office at around 19 weeks.    The patient and her husband stated that all of their questions were answered today.  A total of 30 minutes was spent counseling and coordinating the care for this patient.  Greater than 50% of the time was spent in direct face-to-face contact.

## 2022-06-01 ENCOUNTER — Ambulatory Visit: Payer: Self-pay | Admitting: Maternal & Fetal Medicine

## 2022-06-01 ENCOUNTER — Telehealth: Payer: Self-pay | Admitting: Obstetrics and Gynecology

## 2022-06-01 ENCOUNTER — Other Ambulatory Visit: Payer: Self-pay

## 2022-06-01 ENCOUNTER — Ambulatory Visit: Payer: 59 | Attending: Maternal & Fetal Medicine

## 2022-06-01 DIAGNOSIS — Z8279 Family history of other congenital malformations, deformations and chromosomal abnormalities: Secondary | ICD-10-CM

## 2022-06-01 NOTE — Telephone Encounter (Signed)
PC to [facility], as the lab was unable to run the Panorama testing drawn this week due to the fact that the tube was expired.  She will return to clinic this afternoon for a lab draw.  Cherly Anderson, MS, CGC

## 2022-06-01 NOTE — Progress Notes (Signed)
Lab only visit today to redraw Panorama, as the last sample was not performed due to an expired tube. Results will be called to the patient when they become available, usually in 1 week.  Cherly Anderson, MS, CGC

## 2022-06-13 ENCOUNTER — Telehealth: Payer: Self-pay | Admitting: Obstetrics and Gynecology

## 2022-06-13 NOTE — Telephone Encounter (Signed)
PC to patient after I spoke with [place] regarding Panorama result drawn 06/01/22.  The results are not appearing in our portal or EPIC.  Apparently the order was not linked properly to our [facility] location and therefore was not able to be seen by myself.  The result had already been release to the patient.  Maelynn was able to see the results and understood that they are low risk female.  I will review those in person this week when the lab is able to send them to me.  I will also have those scanned into the media tab if they do not automatically populate into her labs.  Cherly Anderson, MS, CGC

## 2022-06-22 DIAGNOSIS — O34219 Maternal care for unspecified type scar from previous cesarean delivery: Secondary | ICD-10-CM | POA: Insufficient documentation

## 2022-06-22 DIAGNOSIS — Z8279 Family history of other congenital malformations, deformations and chromosomal abnormalities: Secondary | ICD-10-CM | POA: Insufficient documentation

## 2022-07-13 ENCOUNTER — Other Ambulatory Visit: Payer: Self-pay

## 2022-07-13 DIAGNOSIS — O99212 Obesity complicating pregnancy, second trimester: Secondary | ICD-10-CM

## 2022-07-13 DIAGNOSIS — Z8489 Family history of other specified conditions: Secondary | ICD-10-CM

## 2022-07-13 DIAGNOSIS — Z8759 Personal history of other complications of pregnancy, childbirth and the puerperium: Secondary | ICD-10-CM

## 2022-07-13 DIAGNOSIS — N809 Endometriosis, unspecified: Secondary | ICD-10-CM

## 2022-07-18 ENCOUNTER — Ambulatory Visit: Payer: 59 | Attending: Maternal & Fetal Medicine

## 2022-07-18 ENCOUNTER — Other Ambulatory Visit: Payer: Self-pay

## 2022-07-18 VITALS — BP 108/58 | HR 83 | Temp 98.5°F | Ht 62.0 in | Wt 189.5 lb

## 2022-07-18 DIAGNOSIS — Z8759 Personal history of other complications of pregnancy, childbirth and the puerperium: Secondary | ICD-10-CM | POA: Diagnosis not present

## 2022-07-18 DIAGNOSIS — O4442 Low lying placenta NOS or without hemorrhage, second trimester: Secondary | ICD-10-CM | POA: Diagnosis not present

## 2022-07-18 DIAGNOSIS — Z8489 Family history of other specified conditions: Secondary | ICD-10-CM | POA: Insufficient documentation

## 2022-07-18 DIAGNOSIS — O0902 Supervision of pregnancy with history of infertility, second trimester: Secondary | ICD-10-CM | POA: Insufficient documentation

## 2022-07-18 DIAGNOSIS — O99891 Other specified diseases and conditions complicating pregnancy: Secondary | ICD-10-CM | POA: Diagnosis not present

## 2022-07-18 DIAGNOSIS — Z3A19 19 weeks gestation of pregnancy: Secondary | ICD-10-CM | POA: Insufficient documentation

## 2022-07-18 DIAGNOSIS — O34219 Maternal care for unspecified type scar from previous cesarean delivery: Secondary | ICD-10-CM | POA: Insufficient documentation

## 2022-07-18 DIAGNOSIS — Z363 Encounter for antenatal screening for malformations: Secondary | ICD-10-CM | POA: Insufficient documentation

## 2022-07-18 DIAGNOSIS — E669 Obesity, unspecified: Secondary | ICD-10-CM

## 2022-07-18 DIAGNOSIS — O3429 Maternal care due to uterine scar from other previous surgery: Secondary | ICD-10-CM | POA: Diagnosis not present

## 2022-07-18 DIAGNOSIS — O99212 Obesity complicating pregnancy, second trimester: Secondary | ICD-10-CM | POA: Diagnosis not present

## 2022-07-18 DIAGNOSIS — N809 Endometriosis, unspecified: Secondary | ICD-10-CM | POA: Insufficient documentation

## 2022-07-18 DIAGNOSIS — Z98891 History of uterine scar from previous surgery: Secondary | ICD-10-CM | POA: Insufficient documentation

## 2022-07-20 DIAGNOSIS — O4442 Low lying placenta NOS or without hemorrhage, second trimester: Secondary | ICD-10-CM | POA: Insufficient documentation

## 2022-09-19 ENCOUNTER — Ambulatory Visit: Payer: 59

## 2022-11-08 NOTE — H&P (Signed)
Obstetric Preoperative History and Physical  Suzanne Cross is a 32 y.o. [id number] with IUP at [redacted]w[redacted]d presenting for scheduled cesarean section.  No acute concerns.   Prenatal Course Source of Care: [facility]   Pregnancy complications or risks:  1. Previous Cesarean- Scheduled with [facility]B 11/15/22  a. Cesarean section for hx of incision on uterus   i. Hx of ectopic pregnancy x2-Had an incision on uterus from ectopic, needs all pregnancies to be cesarean section  b. Needs repeat c-section with BEB at 37 weeks per MFM  2. Low lying placenta- RESOLVED  a. Low lying posterior placenta 1.8 cm from cervical OS   b. Follow up 8/22 Resolved 3. Obesity, BMI 33  a. NOB CMP, P/C ratio, 1hr GTT: 120 4. Family hx of Down's Syndrome  a. Offered genetic testing - not done  b. Scheduled for anatomy scan with MFM- Done 5. Rubella non-immune  a. Offer vaccine postpartum 6. Hx of infertility used Letrozole  a. Achieved this pregnancy on her own 7. Hx of Anxiety Depression  a. No medications, NOB EPDS 9 ? Screening results and needs:  ? NOB:    ? Medicaid Questionnaire: done    ? [ ]  ACHD Program   ? Depression Score: 9   ? MBT:A+                               Ab screen: Neg     ? HIV: Neg                            RPR: NR     ? Hep B:Neg                           Hep C:NR    ? Pap:4/24 NILM/HPV Neg.   G/C:Neg/Neg    ? Rubella:Non Immune         VZV: Immune   ? AVW:0.981                          HgA1C:5.4   ? Hgb Fractionation: wnl  Patient Active Problem List   Diagnosis Date Noted   Supervision of high risk pregnancy in third trimester 11/15/2022   History of uterine scar from previous surgery 07/18/2022   Family history of Down syndrome 06/22/2022   Previous cesarean delivery affecting pregnancy, antepartum 06/22/2022   Postpartum depression 08/19/2018   Family history of developmental delay    Obesity in pregnancy, antepartum 09/06/2017   Other idiopathic scoliosis, thoracic region 09/06/2017    Overweight (BMI 25.0-29.9) 09/06/2017   History of ectopic pregnancy 06/19/2017   GERD (gastroesophageal reflux disease) 03/14/2017   Major depression in remission (HCC) 03/07/2017   Major depressive disorder, recurrent episode, in partial remission (HCC) 03/07/2017   Ectopic pregnancy 02/11/2016   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   Prenatal labs and studies: ABO, Rh: --/--/A POS (10/14 1914) Antibody: NEG (10/14 0824) Rubella:  non immune RPR:   non reactive HBsAg:   neg HIV: Non Reactive (10/14 0824)  GBS:  pos 1 hr Glu 133 Genetic screening normal Anatomy US normal with MFM  Past Medical History:  Diagnosis Date   Acid reflux disease    Acute pelvic pain, female 07/27/2020   Anemia  Chocolate cyst of ovary    Left   Complication of anesthesia    LOW bp possible from propofol   Depression    Ectopic pregnancy 2019, 2018   Endometriosis    PONV (postoperative nausea and vomiting)    only nausea   Scoliosis     Past Surgical History:  Procedure Laterality Date   CESAREAN SECTION N/A 07/06/2018   Procedure: CESAREAN SECTION;  Surgeon: Ward, Elenora Fender, MD;  Location: [facility] ORS;  Service: Obstetrics;  Laterality: N/A;   CHROMOPERTUBATION N/A 10/15/2021   Procedure: CHROMOPERTUBATION;  Surgeon: Christeen Douglas, MD;  Location: [facility] ORS;  Service: Gynecology;  Laterality: N/A;   COLONOSCOPY WITH PROPOFOL N/A 10/19/2020   Procedure: COLONOSCOPY WITH PROPOFOL;  Surgeon: Wyline Mood, MD;  Location[facility];  Service: Gastroenterology;  Laterality: N/A;   DIAGNOSTIC LAPAROSCOPY WITH REMOVAL OF ECTOPIC PREGNANCY Left 06/02/2017   Procedure: DIAGNOSTIC LAPAROSCOPY WITH REMOVAL OF ECTOPIC PREGNANCY;  Surgeon: Ward, Elenora Fender, MD;  Location: [facility] ORS;  Service: Gynecology;  Laterality: Left;   ECTOPIC PREGNANCY SURGERY  02/2016   ESOPHAGOGASTRODUODENOSCOPY N/A 10/19/2020   Procedure:  ESOPHAGOGASTRODUODENOSCOPY (EGD);  Surgeon: Wyline Mood, MD;  Location: [facility] ENDOSCOPY;  Service: Gastroenterology;  Laterality: N/A;  Patient is allergic to Propofol   HYSTEROSCOPY N/A 02/05/2021   Procedure: Hysteroscopy ;  Surgeon: Christeen Douglas, MD;  Location: [facility] ORS;  Service: Gynecology;  Laterality: N/A;   LAPAROSCOPY N/A 10/15/2021   Procedure: DIAGNOSTIC LAPAROSCOPY WITH EXCISION OF ENDOMETRIOSIS;  Surgeon: Christeen Douglas, MD;  Location: [facility] ORS;  Service: Gynecology;  Laterality: N/A;   LAPAROTOMY N/A 06/02/2017   Procedure: EXPLORATORY LAPAROTOMY;  Surgeon: Ward, Elenora Fender, MD;  Location: [facility] ORS;  Service: Gynecology;  Laterality: N/A;   LAPAROTOMY Left 09/2015   LAPAROTOMY Left 08/2016    OB History  Gravida Para Term Preterm AB Living  4 1 1   2 1   SAB IAB Ectopic Multiple Live Births      2 0 1    # Outcome Date GA Lbr Len/2nd Weight Sex Type Anes PTL Lv  4 Current           3 Term 07/06/18 [redacted]w[redacted]d  2420 g F CS-Vac Spinal  LIV  2 Ectopic 05/31/17          1 Ectopic 02/01/16            Social History   Socioeconomic History   Marital status: Married    Spouse name: Thayer Ohm   Number of children: 1   Years of education: Not on file   Highest education level: Some college, no degree  Occupational History   Occupation: Consulting civil engineer    Comment: [employer]  Tobacco Use   Smoking status: Never   Smokeless tobacco: Never  Vaping Use   Vaping status: Never Used  Substance and Sexual Activity   Alcohol use: Not Currently   Drug use: Never   Sexual activity: Yes    Partners: Male    Birth control/protection: None  Other Topics Concern   Not on file  Social History Narrative   Not on file   Social Determinants of Health   Financial Resource Strain: Low Risk  (07/28/2020)   Overall Financial Resource Strain (CARDIA)    Difficulty of Paying Living Expenses: Not hard at all  Food Insecurity: No Food Insecurity (11/15/2022)   Hunger Vital Sign    Worried About  Running Out of Food in the Last Year: Never true    Ran Out  of Food in the Last Year: Never true  Transportation Needs: No Transportation Needs (11/15/2022)   PRAPARE - Administrator, Civil Service (Medical): No    Lack of Transportation (Non-Medical): No  Physical Activity: Insufficiently Active (07/28/2020)   Exercise Vital Sign    Days of Exercise per Week: 5 days    Minutes of Exercise per Session: 20 min  Stress: No Stress Concern Present (07/28/2020)   [employer] of Occupational Health - Occupational Stress Questionnaire    Feeling of Stress : Only a little  Social Connections: Moderately Isolated (07/28/2020)   Social Connection and Isolation Panel [NHANES]    Frequency of Communication with Friends and Family: Once a week    Frequency of Social Gatherings with Friends and Family: More than three times a week    Attends Religious Services: Never    Database administrator or Organizations: No    Attends Engineer, structural: Never    Marital Status: Married    Family History  Problem Relation Age of Onset   Hypertension Mother    Arthritis Mother    Varicose Veins Mother    Mental retardation Sister    Depression Brother    Clotting disorder Maternal Grandmother    Pulmonary embolism Maternal Grandmother    Hypertension Maternal Grandmother    Varicose Veins Maternal Grandmother    Obesity Maternal Grandmother    Varicose Veins Sister     Medications Prior to Admission  Medication Sig Dispense Refill Last Dose   acetaminophen (TYLENOL) 500 MG tablet Take 1,000 mg by mouth every 6 (six) hours as needed for mild pain.   Past Week   Prenatal Vit-Fe Fumarate-FA (PRENATAL MULTIVITAMIN) TABS tablet Take 1 tablet by mouth daily at 12 noon.   11/14/2022   Docosahexaenoic Acid (DHA OMEGA 3 PO) Take 1 tablet by mouth daily. (Patient not taking: Reported on 11/15/2022)   Not Taking   ondansetron (ZOFRAN-ODT) 4 MG disintegrating tablet Take 4 mg by mouth  every 8 (eight) hours as needed for nausea or vomiting. (Patient not taking: Reported on 07/18/2022)   Not Taking    Allergies  Allergen Reactions   Propofol     Hypotension    Review of Systems: Negative except for what is mentioned in HPI.  Physical Exam: BP 118/81   Pulse (!) 110   Temp 97.9 F (36.6 C) (Oral)   Resp 18   Ht 5\' 2"  (1.575 m)   Wt 92 kg   LMP 03/06/2021   Breastfeeding Unknown   BMI 37.10 kg/m  FHR by Doppler: 150 bpm CONSTITUTIONAL: Well-developed, well-nourished female in no acute distress.  NECK: Normal range of motion, supple, no masses SKIN: Skin is warm and dry. No rash noted. Not diaphoretic. No erythema. No pallor.  NEUROLGIC: Alert and oriented to person, place, and time. Normal reflexes, muscle tone coordination.  PSYCHIATRIC: Normal mood and affect. Normal behavior. Normal judgment and thought content. CARDIOVASCULAR: Normal heart rate noted, regular rhythm RESPIRATORY: Effort and breath sounds normal, no problems with respiration noted ABDOMEN: Soft, nontender, nondistended, gravid. Well-healed Pfannenstiel incision. PELVIC: Deferred MUSCULOSKELETAL: Normal range of motion. No edema and no tenderness.    Pertinent Labs/Studies:   Results for orders placed or performed during the hospital encounter of 11/14/22 (from the past 72 hour(s))  HIV Antibody (routine testing w rflx)     Status: None   Collection Time: 11/14/22  8:24 AM  Result Value Ref Range   HIV Screen  4th Generation wRfx Non Reactive Non Reactive    Comment: Performed at [facility], [address] [place], [place] [address]  CBC     Status: Abnormal   Collection Time: 11/14/22  8:24 AM  Result Value Ref Range   WBC 8.7 4.0 - 10.5 K/uL   RBC 4.28 3.87 - 5.11 MIL/uL   Hemoglobin 12.1 12.0 - 15.0 g/dL   HCT 60.4 (L) 54.0 - 98.1 %   MCV 83.6 80.0 - 100.0 fL   MCH 28.3 26.0 - 34.0 pg   MCHC 33.8 30.0 - 36.0 g/dL   RDW 19.1 47.8 - 29.5 %   Platelets 257 150 - 400 K/uL    nRBC 0.0 0.0 - 0.2 %    Comment: Performed at [facility], [address] [place], [place] [address]  Type and screen [facility]     Status: None   Collection Time: 11/14/22  8:24 AM  Result Value Ref Range   ABO/RH(D) A POS    Antibody Screen NEG    Sample Expiration 11/17/2022,2359    Extend sample reason      PREGNANT WITHIN 3 MONTHS, UNABLE TO EXTEND Performed at [facility], [address] [place], [place] [address]     Assessment and Plan :Dotsie Gillette is a 32 y.o. [id number] at [redacted]w[redacted]d being admitted for scheduled cesarean section. The risks of cesarean section discussed with the patient included but were not limited to: bleeding which may require transfusion or reoperation; infection which may require antibiotics; injury to bowel, bladder, ureters or other surrounding organs; injury to the fetus; need for additional procedures including hysterectomy in the event of a life-threatening hemorrhage; placental abnormalities wth subsequent pregnancies, incisional problems, thromboembolic phenomenon and other postoperative/anesthesia complications. The patient concurred with the proposed plan, giving informed written consent for the procedure. Patient has been NPO since last night she will remain NPO for procedure. Anesthesia and OR aware. Preoperative prophylactic antibiotics and SCDs ordered on call to the OR. To OR when ready.    Christeen Douglas, MD, MPH, [facility]

## 2022-11-09 ENCOUNTER — Encounter
Admission: RE | Admit: 2022-11-09 | Discharge: 2022-11-09 | Disposition: A | Discharge status: Home / Self Care | Payer: 59 | Source: Ambulatory Visit | Attending: Obstetrics and Gynecology | Admitting: Obstetrics and Gynecology

## 2022-11-09 HISTORY — DX: Depression, unspecified: F32.A

## 2022-11-09 NOTE — Patient Instructions (Addendum)
Your procedure is scheduled on:11-15-22 Tuesday  Arrival Time: 5:30 am-Please call Labor and Delivery if you have any questions [phone].  Arrival: If your arrival time is prior to 6:00 am, please enter through the Emergency Room Entrance and you will be directed to Labor and Delivery. If your arrival time is 6:00 am or later, please enter the Medical Mall and follow the greeter's instructions.  REMEMBER: Instructions that are not followed completely may result in serious medical risk, up to and including death; or upon the discretion of your surgeon and anesthesiologist your surgery may need to be rescheduled.  Do not eat food OR drink any liquids after midnight the night before surgery.  No gum chewing or hard candies.  One week prior to surgery: Stop Anti-inflammatories (NSAIDS) such as Advil, Aleve, Ibuprofen, Motrin, Naproxen, Naprosyn and Aspirin based products such as Excedrin, Goody's Powder, BC Powder. Stop ANY OVER THE COUNTER supplements until after surgery (DHA Omega 3)  You may however, continue to take Tylenol if needed for pain up until the day of surgery.  Continue taking all of your other prescription medications up until the day of surgery.  Do NOT take any medication the day of surgery  No Alcohol for 24 hours before or after surgery.  No Smoking including e-cigarettes for 24 hours prior to surgery.  No chewable tobacco products for at least 6 hours prior to surgery.  No nicotine patches on the day of surgery.  Do not use any "recreational" drugs for at least a week prior to your surgery.  Please be advised that the combination of cocaine and anesthesia may have negative outcomes, up to and including death. If you test positive for cocaine, your surgery will be cancelled.  On the morning of surgery brush your teeth with toothpaste and water, you may rinse your mouth with mouthwash if you wish. Do not swallow any toothpaste or mouthwash.  Use CHG soap as  directed on instruction sheet (Avoid Nipple and Private area)  Do not wear jewelry, make-up, hairpins, clips or nail polish.  For welded (permanent) jewelry: bracelets, anklets, waist bands, etc.  Please have this removed prior to surgery.  If it is not removed, there is a chance that hospital personnel will need to cut it off on the day of surgery.  Do not wear lotions, powders, or perfumes.   Do not shave body hair from the neck down 48 hours before surgery.  Contact lenses, hearing aids and dentures may not be worn into surgery.  Do not bring valuables to the hospital. [facility] is not responsible for any missing/lost belongings or valuables.   Notify your doctor if there is any change in your medical condition (cold, fever, infection).  Wear comfortable clothing (specific to your surgery type) to the hospital.  After surgery, you can help prevent lung complications by doing breathing exercises.  Take deep breaths and cough every 1-2 hours. Your doctor may order a device called an Incentive Spirometer to help you take deep breaths. When coughing or sneezing, hold a pillow firmly against your incision with both hands. This is called "splinting." Doing this helps protect your incision. It also decreases belly discomfort.  Please call the Pre-admissions Testing Dept. at [phone] if you have any questions about these instructions.  Surgery Visitation Policy:  Visitor Passes   All visitors, including children, need an identification sticker when visiting. These stickers must be worn where they can be seen.   Labor & Delivery  Laboring  women may have one designated support person and two other visitors of any age visit. The support person must remain the same. The visitors may switch with other visitors. Visitation is permitted 24 hours per day. The designated support person or a visitor over the age of 16 may sleep overnight in the patient's room. A doula registered with  [facility] for labor and delivery support is not considered a visitor. Doulas not registered with  are considered visitors.  Mother Baby Unit, OB Specialty and Gynecological Care  A designated support person and three visitors of any age may visit. The three visitors may switch out. The designated support person or a visitor age 79 or older may stay overnight in the room. During the postpartum period (up to 6 weeks), if the mother is the patient, she can have her newborn stay with her if there is another support person present who can be responsible for the baby.       Preparing for Surgery with CHLORHEXIDINE GLUCONATE (CHG) Soap  Chlorhexidine Gluconate (CHG) Soap  o An antiseptic cleaner that kills germs and bonds with the skin to continue killing germs even after washing  o Used for showering the night before surgery and morning of surgery  Before surgery, you can play an important role by reducing the number of germs on your skin.  CHG (Chlorhexidine gluconate) soap is an antiseptic cleanser which kills germs and bonds with the skin to continue killing germs even after washing.  Please do not use if you have an allergy to CHG or antibacterial soaps. If your skin becomes reddened/irritated stop using the CHG.  1. Shower the NIGHT BEFORE SURGERY and the MORNING OF SURGERY with CHG soap.  2. If you choose to wash your hair, wash your hair first as usual with your normal shampoo.  3. After shampooing, rinse your hair and body thoroughly to remove the shampoo.  4. Use CHG as you would any other liquid soap. You can apply CHG directly to the skin and wash gently with a scrungie or a clean washcloth.  5. Apply the CHG soap to your body only from the neck down. Do not use on open wounds or open sores. Avoid contact with your eyes, ears, mouth, and genitals (private parts). Wash face and genitals (private parts) with your normal soap.  6. Wash thoroughly, paying special  attention to the area where your surgery will be performed.  7. Thoroughly rinse your body with warm water.  8. Do not shower/wash with your normal soap after using and rinsing off the CHG soap.  9. Pat yourself dry with a clean towel.  10. Wear clean pajamas to bed the night before surgery.  12. Place clean sheets on your bed the night of your first shower and do not sleep with pets.  13. Shower again with the CHG soap on the day of surgery prior to arriving at the hospital.  14. Do not apply any deodorants/lotions/powders.  15. Please wear clean clothes to the hospital.  How to Use an Incentive Spirometer An incentive spirometer is a tool that measures how well you are filling your lungs with each breath. Learning to take long, deep breaths using this tool can help you keep your lungs clear and active. This may help to reverse or lessen your chance of developing breathing (pulmonary) problems, especially infection. You may be asked to use a spirometer: After a surgery. If you have a lung problem or a history of smoking.  After a long period of time when you have been unable to move or be active. If the spirometer includes an indicator to show the highest number that you have reached, your health care provider or respiratory therapist will help you set a goal. Keep a log of your progress as told by your health care provider. What are the risks? Breathing too quickly may cause dizziness or cause you to pass out. Take your time so you do not get dizzy or light-headed. If you are in pain, you may need to take pain medicine before doing incentive spirometry. It is harder to take a deep breath if you are having pain. How to use your incentive spirometer  Sit up on the edge of your bed or on a chair. Hold the incentive spirometer so that it is in an upright position. Before you use the spirometer, breathe out normally. Place the mouthpiece in your mouth. Make sure your lips are closed  tightly around it. Breathe in slowly and as deeply as you can through your mouth, causing the piston or the ball to rise toward the top of the chamber. Hold your breath for 3-5 seconds, or for as long as possible. If the spirometer includes a coach indicator, use this to guide you in breathing. Slow down your breathing if the indicator goes above the marked areas. Remove the mouthpiece from your mouth and breathe out normally. The piston or ball will return to the bottom of the chamber. Rest for a few seconds, then repeat the steps 10 or more times. Take your time and take a few normal breaths between deep breaths so that you do not get dizzy or light-headed. Do this every 1-2 hours when you are awake. If the spirometer includes a goal marker to show the highest number you have reached (best effort), use this as a goal to work toward during each repetition. After each set of 10 deep breaths, cough a few times. This will help to make sure that your lungs are clear. If you have an incision on your chest or abdomen from surgery, place a pillow or a rolled-up towel firmly against the incision when you cough. This can help to reduce pain while taking deep breaths and coughing. General tips When you are able to get out of bed: Walk around often. Continue to take deep breaths and cough in order to clear your lungs. Keep using the incentive spirometer until your health care provider says it is okay to stop using it. If you have been in the hospital, you may be told to keep using the spirometer at home. Contact a health care provider if: You are having difficulty using the spirometer. You have trouble using the spirometer as often as instructed. Your pain medicine is not giving enough relief for you to use the spirometer as told. You have a fever. Get help right away if: You develop shortness of breath. You develop a cough with bloody mucus from the lungs. You have fluid or blood coming from an  incision site after you cough. Summary An incentive spirometer is a tool that can help you learn to take long, deep breaths to keep your lungs clear and active. You may be asked to use a spirometer after a surgery, if you have a lung problem or a history of smoking, or if you have been inactive for a long period of time. Use your incentive spirometer as instructed every 1-2 hours while you are awake. If you have an  incision on your chest or abdomen, place a pillow or a rolled-up towel firmly against your incision when you cough. This will help to reduce pain. Get help right away if you have shortness of breath, you cough up bloody mucus, or blood comes from your incision when you cough. This information is not intended to replace advice given to you by your health care provider. Make sure you discuss any questions you have with your health care provider. Document Revised: 04/08/2019 Document Reviewed: 04/08/2019 Elsevier Patient Education  2024 [employer].

## 2022-11-14 ENCOUNTER — Encounter
Admission: RE | Admit: 2022-11-14 | Discharge: 2022-11-14 | Disposition: A | Discharge status: Home / Self Care | Payer: 59 | Source: Ambulatory Visit | Attending: Obstetrics and Gynecology | Admitting: Obstetrics and Gynecology

## 2022-11-14 DIAGNOSIS — Z01812 Encounter for preprocedural laboratory examination: Secondary | ICD-10-CM | POA: Insufficient documentation

## 2022-11-14 DIAGNOSIS — O34219 Maternal care for unspecified type scar from previous cesarean delivery: Secondary | ICD-10-CM

## 2022-11-14 LAB — CBC
HCT: 35.8 % — ABNORMAL LOW (ref 36.0–46.0)
Hemoglobin: 12.1 g/dL (ref 12.0–15.0)
MCH: 28.3 pg (ref 26.0–34.0)
MCHC: 33.8 g/dL (ref 30.0–36.0)
MCV: 83.6 fL (ref 80.0–100.0)
Platelets: 257 10*3/uL (ref 150–400)
RBC: 4.28 MIL/uL (ref 3.87–5.11)
RDW: 13.3 % (ref 11.5–15.5)
WBC: 8.7 10*3/uL (ref 4.0–10.5)
nRBC: 0 % (ref 0.0–0.2)

## 2022-11-14 LAB — TYPE AND SCREEN
ABO/RH(D): A POS
Antibody Screen: NEGATIVE
Extend sample reason: UNDETERMINED

## 2022-11-14 LAB — HIV ANTIBODY (ROUTINE TESTING W REFLEX): HIV Screen 4th Generation wRfx: NONREACTIVE

## 2022-11-15 ENCOUNTER — Inpatient Hospital Stay: Payer: 59 | Admitting: Certified Registered"

## 2022-11-15 ENCOUNTER — Other Ambulatory Visit: Payer: Self-pay

## 2022-11-15 ENCOUNTER — Inpatient Hospital Stay
Admission: RE | Admit: 2022-11-15 | Discharge: 2022-11-18 | DRG: 787 | Disposition: A | Discharge status: Home / Self Care | Payer: 59 | Attending: Obstetrics and Gynecology | Admitting: Obstetrics and Gynecology

## 2022-11-15 ENCOUNTER — Encounter
Admission: RE | Disposition: A | Discharge status: Home / Self Care | Payer: Self-pay | Source: Home / Self Care | Attending: Obstetrics and Gynecology

## 2022-11-15 ENCOUNTER — Inpatient Hospital Stay: Payer: 59 | Admitting: Urgent Care

## 2022-11-15 DIAGNOSIS — Z832 Family history of diseases of the blood and blood-forming organs and certain disorders involving the immune mechanism: Secondary | ICD-10-CM

## 2022-11-15 DIAGNOSIS — O34219 Maternal care for unspecified type scar from previous cesarean delivery: Principal | ICD-10-CM

## 2022-11-15 DIAGNOSIS — Z810 Family history of intellectual disabilities: Secondary | ICD-10-CM | POA: Diagnosis not present

## 2022-11-15 DIAGNOSIS — Z8249 Family history of ischemic heart disease and other diseases of the circulatory system: Secondary | ICD-10-CM

## 2022-11-15 DIAGNOSIS — D62 Acute posthemorrhagic anemia: Secondary | ICD-10-CM | POA: Diagnosis not present

## 2022-11-15 DIAGNOSIS — Z8261 Family history of arthritis: Secondary | ICD-10-CM | POA: Diagnosis not present

## 2022-11-15 DIAGNOSIS — O99824 Streptococcus B carrier state complicating childbirth: Secondary | ICD-10-CM | POA: Diagnosis present

## 2022-11-15 DIAGNOSIS — Z8279 Family history of other congenital malformations, deformations and chromosomal abnormalities: Secondary | ICD-10-CM | POA: Diagnosis not present

## 2022-11-15 DIAGNOSIS — Z884 Allergy status to anesthetic agent status: Secondary | ICD-10-CM | POA: Diagnosis not present

## 2022-11-15 DIAGNOSIS — O34211 Maternal care for low transverse scar from previous cesarean delivery: Principal | ICD-10-CM | POA: Diagnosis present

## 2022-11-15 DIAGNOSIS — O9962 Diseases of the digestive system complicating childbirth: Secondary | ICD-10-CM | POA: Diagnosis present

## 2022-11-15 DIAGNOSIS — K219 Gastro-esophageal reflux disease without esophagitis: Secondary | ICD-10-CM | POA: Diagnosis present

## 2022-11-15 DIAGNOSIS — O0993 Supervision of high risk pregnancy, unspecified, third trimester: Secondary | ICD-10-CM

## 2022-11-15 DIAGNOSIS — Z3A37 37 weeks gestation of pregnancy: Secondary | ICD-10-CM | POA: Diagnosis not present

## 2022-11-15 DIAGNOSIS — O99214 Obesity complicating childbirth: Secondary | ICD-10-CM | POA: Diagnosis present

## 2022-11-15 DIAGNOSIS — O9081 Anemia of the puerperium: Secondary | ICD-10-CM | POA: Diagnosis not present

## 2022-11-15 DIAGNOSIS — Z818 Family history of other mental and behavioral disorders: Secondary | ICD-10-CM

## 2022-11-15 DIAGNOSIS — O99210 Obesity complicating pregnancy, unspecified trimester: Secondary | ICD-10-CM | POA: Diagnosis present

## 2022-11-15 DIAGNOSIS — Z98891 History of uterine scar from previous surgery: Secondary | ICD-10-CM | POA: Diagnosis present

## 2022-11-15 SURGERY — Surgical Case
Anesthesia: Spinal

## 2022-11-15 MED ORDER — OXYTOCIN-SODIUM CHLORIDE 30-0.9 UT/500ML-% IV SOLN
INTRAVENOUS | Status: DC | PRN
  Administered 2022-11-15: 250 mL/h via INTRAVENOUS

## 2022-11-15 MED ORDER — BUPIVACAINE IN DEXTROSE 0.75-8.25 % IT SOLN
INTRATHECAL | Status: DC | PRN
  Administered 2022-11-15: 1.4 mL via INTRATHECAL

## 2022-11-15 MED ORDER — CEFAZOLIN SODIUM-DEXTROSE 2-4 GM/100ML-% IV SOLN
2.0000 g | INTRAVENOUS | Status: AC
  Administered 2022-11-15: 2 g via INTRAVENOUS
  Filled 2022-11-15: qty 100

## 2022-11-15 MED ORDER — LACTATED RINGERS IV SOLN: Freq: Once | INTRAVENOUS | Status: DC

## 2022-11-15 MED ORDER — FENTANYL CITRATE (PF) 100 MCG/2ML IJ SOLN
INTRAMUSCULAR | Status: AC
  Filled 2022-11-15: qty 2

## 2022-11-15 MED ORDER — OXYCODONE HCL 5 MG PO TABS
5.0000 mg | ORAL_TABLET | Freq: Four times a day (QID) | ORAL | Status: DC | PRN
  Administered 2022-11-15: 5 mg via ORAL
  Filled 2022-11-15: qty 1

## 2022-11-15 MED ORDER — MORPHINE SULFATE (PF) 0.5 MG/ML IJ SOLN
INTRAMUSCULAR | Status: AC
  Filled 2022-11-15: qty 10

## 2022-11-15 MED ORDER — DIBUCAINE (PERIANAL) 1 % EX OINT: 1.0000 | TOPICAL_OINTMENT | CUTANEOUS | Status: DC | PRN

## 2022-11-15 MED ORDER — POVIDONE-IODINE 10 % EX SWAB
2.0000 | Freq: Once | CUTANEOUS | Status: AC
  Administered 2022-11-15: 2 via TOPICAL

## 2022-11-15 MED ORDER — SIMETHICONE 80 MG PO CHEW
80.0000 mg | CHEWABLE_TABLET | Freq: Three times a day (TID) | ORAL | Status: DC
  Administered 2022-11-15 – 2022-11-18 (×10): 80 mg via ORAL
  Filled 2022-11-15 (×10): qty 1

## 2022-11-15 MED ORDER — ONDANSETRON HCL 4 MG/2ML IJ SOLN: 4.0000 mg | Freq: Three times a day (TID) | INTRAMUSCULAR | Status: DC | PRN

## 2022-11-15 MED ORDER — SODIUM CHLORIDE 0.9 % IV SOLN: 12.5000 mg | INTRAVENOUS | Status: DC | PRN

## 2022-11-15 MED ORDER — NALOXONE HCL 0.4 MG/ML IJ SOLN: 0.4000 mg | INTRAMUSCULAR | Status: DC | PRN

## 2022-11-15 MED ORDER — OXYTOCIN-SODIUM CHLORIDE 30-0.9 UT/500ML-% IV SOLN
INTRAVENOUS | Status: AC
  Filled 2022-11-15: qty 500

## 2022-11-15 MED ORDER — LACTATED RINGERS IV SOLN: INTRAVENOUS | Status: AC

## 2022-11-15 MED ORDER — COCONUT OIL OIL
1.0000 | TOPICAL_OIL | Status: DC | PRN
  Filled 2022-11-15: qty 7.5

## 2022-11-15 MED ORDER — KETOROLAC TROMETHAMINE 30 MG/ML IJ SOLN
30.0000 mg | Freq: Four times a day (QID) | INTRAMUSCULAR | Status: AC
  Administered 2022-11-15 – 2022-11-16 (×4): 30 mg via INTRAVENOUS
  Filled 2022-11-15 (×4): qty 1

## 2022-11-15 MED ORDER — MENTHOL 3 MG MT LOZG: 1.0000 | LOZENGE | OROMUCOSAL | Status: DC | PRN

## 2022-11-15 MED ORDER — PHENYLEPHRINE HCL-NACL 20-0.9 MG/250ML-% IV SOLN
INTRAVENOUS | Status: DC | PRN
  Administered 2022-11-15: 50 ug/min via INTRAVENOUS

## 2022-11-15 MED ORDER — IBUPROFEN 600 MG PO TABS
600.0000 mg | ORAL_TABLET | Freq: Four times a day (QID) | ORAL | Status: DC
  Administered 2022-11-16 – 2022-11-18 (×9): 600 mg via ORAL
  Filled 2022-11-15 (×9): qty 1

## 2022-11-15 MED ORDER — 0.9 % SODIUM CHLORIDE (POUR BTL) OPTIME
TOPICAL | Status: DC | PRN
  Administered 2022-11-15: 1000 mL

## 2022-11-15 MED ORDER — BISACODYL 10 MG RE SUPP: 10.0000 mg | Freq: Every day | RECTAL | Status: DC | PRN

## 2022-11-15 MED ORDER — ACETAMINOPHEN 500 MG PO TABS
1000.0000 mg | ORAL_TABLET | Freq: Four times a day (QID) | ORAL | Status: DC
  Administered 2022-11-15 – 2022-11-18 (×13): 1000 mg via ORAL
  Filled 2022-11-15 (×14): qty 2

## 2022-11-15 MED ORDER — BUPIVACAINE HCL (PF) 0.5 % IJ SOLN
INTRAMUSCULAR | Status: DC | PRN
  Administered 2022-11-15: 25 mL

## 2022-11-15 MED ORDER — DIPHENHYDRAMINE HCL 50 MG/ML IJ SOLN: 12.5000 mg | INTRAMUSCULAR | Status: DC | PRN

## 2022-11-15 MED ORDER — SODIUM CHLORIDE 0.9% FLUSH
INTRAVENOUS | Status: DC | PRN
  Administered 2022-11-15: 20 mL

## 2022-11-15 MED ORDER — PHENYLEPHRINE 80 MCG/ML (10ML) SYRINGE FOR IV PUSH (FOR BLOOD PRESSURE SUPPORT)
PREFILLED_SYRINGE | INTRAVENOUS | Status: DC | PRN
  Administered 2022-11-15: 160 ug via INTRAVENOUS

## 2022-11-15 MED ORDER — TETANUS-DIPHTH-ACELL PERTUSSIS 5-2.5-18.5 LF-MCG/0.5 IM SUSY: 0.5000 mL | PREFILLED_SYRINGE | Freq: Once | INTRAMUSCULAR | Status: DC

## 2022-11-15 MED ORDER — SENNOSIDES-DOCUSATE SODIUM 8.6-50 MG PO TABS
2.0000 | ORAL_TABLET | ORAL | Status: DC
  Administered 2022-11-15 – 2022-11-16 (×2): 2 via ORAL
  Filled 2022-11-15 (×4): qty 2

## 2022-11-15 MED ORDER — OXYCODONE HCL 5 MG PO TABS
5.0000 mg | ORAL_TABLET | ORAL | Status: DC | PRN
  Administered 2022-11-15 – 2022-11-16 (×3): 10 mg via ORAL
  Administered 2022-11-17 (×2): 5 mg via ORAL
  Administered 2022-11-17: 10 mg via ORAL
  Administered 2022-11-17: 5 mg via ORAL
  Administered 2022-11-17: 10 mg via ORAL
  Administered 2022-11-18 (×2): 5 mg via ORAL
  Administered 2022-11-18: 10 mg via ORAL
  Filled 2022-11-15: qty 2
  Filled 2022-11-15 (×3): qty 1
  Filled 2022-11-15 (×2): qty 2
  Filled 2022-11-15 (×2): qty 1
  Filled 2022-11-15 (×3): qty 2

## 2022-11-15 MED ORDER — DIPHENHYDRAMINE HCL 25 MG PO CAPS: 25.0000 mg | ORAL_CAPSULE | ORAL | Status: DC | PRN

## 2022-11-15 MED ORDER — MORPHINE SULFATE (PF) 0.5 MG/ML IJ SOLN
INTRAMUSCULAR | Status: DC | PRN
  Administered 2022-11-15: .1 mg via INTRATHECAL

## 2022-11-15 MED ORDER — MEPERIDINE HCL 25 MG/ML IJ SOLN
INTRAMUSCULAR | Status: AC
  Filled 2022-11-15: qty 1

## 2022-11-15 MED ORDER — PHENYLEPHRINE HCL-NACL 20-0.9 MG/250ML-% IV SOLN
INTRAVENOUS | Status: AC
  Filled 2022-11-15: qty 250

## 2022-11-15 MED ORDER — PRENATAL MULTIVITAMIN CH
1.0000 | ORAL_TABLET | Freq: Every day | ORAL | Status: DC
  Administered 2022-11-15 – 2022-11-18 (×4): 1 via ORAL
  Filled 2022-11-15 (×4): qty 1

## 2022-11-15 MED ORDER — FERROUS SULFATE 325 (65 FE) MG PO TABS
325.0000 mg | ORAL_TABLET | Freq: Two times a day (BID) | ORAL | Status: DC
  Administered 2022-11-15: 325 mg via ORAL
  Filled 2022-11-15: qty 1

## 2022-11-15 MED ORDER — DIPHENHYDRAMINE HCL 25 MG PO CAPS: 25.0000 mg | ORAL_CAPSULE | Freq: Four times a day (QID) | ORAL | Status: DC | PRN

## 2022-11-15 MED ORDER — LACTATED RINGERS IV SOLN: INTRAVENOUS | Status: DC

## 2022-11-15 MED ORDER — SODIUM CHLORIDE 0.9% FLUSH: 3.0000 mL | INTRAVENOUS | Status: DC | PRN

## 2022-11-15 MED ORDER — NALOXONE HCL 4 MG/10ML IJ SOLN
1.0000 ug/kg/h | INTRAVENOUS | Status: DC | PRN
  Filled 2022-11-15: qty 5

## 2022-11-15 MED ORDER — FENTANYL CITRATE (PF) 100 MCG/2ML IJ SOLN: 25.0000 ug | INTRAMUSCULAR | Status: DC | PRN

## 2022-11-15 MED ORDER — MEASLES, MUMPS & RUBELLA VAC IJ SOLR
0.5000 mL | Freq: Once | INTRAMUSCULAR | Status: DC
  Filled 2022-11-15: qty 0.5

## 2022-11-15 MED ORDER — GABAPENTIN 300 MG PO CAPS
300.0000 mg | ORAL_CAPSULE | Freq: Every day | ORAL | Status: DC
  Administered 2022-11-15 – 2022-11-17 (×3): 300 mg via ORAL
  Filled 2022-11-15 (×3): qty 1

## 2022-11-15 MED ORDER — SOD CITRATE-CITRIC ACID 500-334 MG/5ML PO SOLN
ORAL | Status: AC
  Administered 2022-11-15: 30 mL via ORAL
  Filled 2022-11-15: qty 15

## 2022-11-15 MED ORDER — EPHEDRINE SULFATE-NACL 50-0.9 MG/10ML-% IV SOSY
PREFILLED_SYRINGE | INTRAVENOUS | Status: DC | PRN
  Administered 2022-11-15 (×2): 5 mg via INTRAVENOUS

## 2022-11-15 MED ORDER — FLEET ENEMA RE ENEM: 1.0000 | ENEMA | Freq: Every day | RECTAL | Status: DC | PRN

## 2022-11-15 MED ORDER — MEPERIDINE HCL 25 MG/ML IJ SOLN: 6.2500 mg | INTRAMUSCULAR | Status: DC | PRN

## 2022-11-15 MED ORDER — WITCH HAZEL-GLYCERIN EX PADS: 1.0000 | MEDICATED_PAD | CUTANEOUS | Status: DC | PRN

## 2022-11-15 MED ORDER — SOD CITRATE-CITRIC ACID 500-334 MG/5ML PO SOLN: 30.0000 mL | ORAL | Status: AC

## 2022-11-15 MED ORDER — ONDANSETRON HCL 4 MG/2ML IJ SOLN
INTRAMUSCULAR | Status: DC | PRN
  Administered 2022-11-15: 4 mg via INTRAVENOUS

## 2022-11-15 MED ORDER — KETOROLAC TROMETHAMINE 30 MG/ML IJ SOLN
30.0000 mg | Freq: Four times a day (QID) | INTRAMUSCULAR | Status: AC | PRN
  Administered 2022-11-15: 30 mg via INTRAVENOUS
  Filled 2022-11-15: qty 1

## 2022-11-15 MED ORDER — DEXAMETHASONE SODIUM PHOSPHATE 10 MG/ML IJ SOLN
INTRAMUSCULAR | Status: DC | PRN
Start: 2022-11-15 — End: 2022-11-15
  Administered 2022-11-15: 8 mg via INTRAVENOUS

## 2022-11-15 MED ORDER — FENTANYL CITRATE (PF) 100 MCG/2ML IJ SOLN
INTRAMUSCULAR | Status: DC | PRN
  Administered 2022-11-15: 85 ug via INTRATHECAL
  Administered 2022-11-15: 15 ug via INTRATHECAL

## 2022-11-15 MED ORDER — BUPIVACAINE HCL (PF) 0.5 % IJ SOLN
INTRAMUSCULAR | Status: AC
  Filled 2022-11-15: qty 60

## 2022-11-15 MED ORDER — KETOROLAC TROMETHAMINE 30 MG/ML IJ SOLN: 30.0000 mg | Freq: Four times a day (QID) | INTRAMUSCULAR | Status: AC | PRN

## 2022-11-15 MED ORDER — SIMETHICONE 80 MG PO CHEW: 80.0000 mg | CHEWABLE_TABLET | ORAL | Status: DC | PRN

## 2022-11-15 MED ORDER — OXYTOCIN-SODIUM CHLORIDE 30-0.9 UT/500ML-% IV SOLN: 2.5000 [IU]/h | INTRAVENOUS | Status: AC

## 2022-11-15 SURGICAL SUPPLY — 32 items
APL PRP STRL LF DISP 70% ISPRP (MISCELLANEOUS) ×1
CHLORAPREP W/TINT 26 (MISCELLANEOUS) ×1 IMPLANT
DRESSING SURGICEL FIBRLLR 1X2 (HEMOSTASIS) IMPLANT
DRSG SURGICEL FIBRILLAR 1X2 (HEMOSTASIS) ×1
DRSG TELFA 3X8 NADH STRL (GAUZE/BANDAGES/DRESSINGS) ×1 IMPLANT
ELECT REM PT RETURN 9FT ADLT (ELECTROSURGICAL) ×1
ELECTRODE REM PT RTRN 9FT ADLT (ELECTROSURGICAL) ×1 IMPLANT
GAUZE SPONGE 4X4 12PLY STRL (GAUZE/BANDAGES/DRESSINGS) ×1 IMPLANT
GOWN STRL REUS W/ TWL LRG LVL3 (GOWN DISPOSABLE) ×3 IMPLANT
GOWN STRL REUS W/TWL LRG LVL3 (GOWN DISPOSABLE) ×3
MANIFOLD NEPTUNE II (INSTRUMENTS) ×1 IMPLANT
MAT PREVALON FULL STRYKER (MISCELLANEOUS) ×1 IMPLANT
NDL HYPO 25GX1X1/2 BEV (NEEDLE) ×1 IMPLANT
NEEDLE HYPO 25GX1X1/2 BEV (NEEDLE) ×1
NS IRRIG 1000ML POUR BTL (IV SOLUTION) ×1 IMPLANT
PACK C SECTION AR (MISCELLANEOUS) ×1 IMPLANT
PAD OB MATERNITY 4.3X12.25 (PERSONAL CARE ITEMS) ×1 IMPLANT
PAD PREP OB/GYN DISP 24X41 (PERSONAL CARE ITEMS) ×1 IMPLANT
SCRUB CHG 4% DYNA-HEX 4OZ (MISCELLANEOUS) ×1 IMPLANT
SPONGE GAUZE 4X4 12PLY (GAUZE/BANDAGES/DRESSINGS) IMPLANT
SUT MNCRL 4-0 (SUTURE) ×1
SUT MNCRL 4-0 27XMFL (SUTURE) ×1
SUT VIC AB 0 CT1 36 (SUTURE) ×2 IMPLANT
SUT VIC AB 0 CTX 36 (SUTURE) ×2
SUT VIC AB 0 CTX36XBRD ANBCTRL (SUTURE) ×2 IMPLANT
SUT VIC AB 2-0 SH 27 (SUTURE) ×2
SUT VIC AB 2-0 SH 27XBRD (SUTURE) ×2 IMPLANT
SUTURE MNCRL 4-0 27XMF (SUTURE) ×1 IMPLANT
SYR 30ML LL (SYRINGE) ×2 IMPLANT
TAPE PAPER 3X10 WHT MICROPORE (GAUZE/BANDAGES/DRESSINGS) IMPLANT
TRAP FLUID SMOKE EVACUATOR (MISCELLANEOUS) ×1 IMPLANT
WATER STERILE IRR 500ML POUR (IV SOLUTION) ×1 IMPLANT

## 2022-11-15 NOTE — Anesthesia Procedure Notes (Signed)
Spinal  Patient location during procedure: OR Start time: 11/15/2022 7:51 AM Reason for block: surgical anesthesia Staffing Performed: resident/CRNA  Performed by: Jaye Beagle, CRNA Authorized by: Lenard Simmer, MD   Preanesthetic Checklist Completed: patient identified, IV checked, site marked, risks and benefits discussed, surgical consent, monitors and equipment checked, pre-op evaluation and timeout performed Spinal Block Patient position: sitting Prep: ChloraPrep Patient monitoring: heart rate, cardiac monitor, continuous pulse ox and blood pressure Approach: midline Location: L3-4 Injection technique: single-shot Needle Needle type: Sprotte  Needle gauge: 24 G Needle length: 9 cm Assessment Sensory level: T4 Events: CSF return Additional Notes Atraumatic attempt x1.  Pt tolerated well.  Negative heme, paresthesia, pain with injection.  Good free flow CSF pre/post injection.

## 2022-11-15 NOTE — Transfer of Care (Signed)
Immediate Anesthesia Transfer of Care Note  Patient: Suzanne Cross  Procedure(s) Performed: REPEAT CESAREAN SECTION  Patient Location: Mother/Baby  Anesthesia Type:Spinal  Level of Consciousness: awake  Airway & Oxygen Therapy: Patient Spontanous Breathing  Post-op Assessment: Report given to RN  Post vital signs: stable  Last Vitals:  Vitals Value Taken Time  BP    Temp    Pulse    Resp    SpO2      Last Pain:  Vitals:   11/15/22 0726  TempSrc:   PainSc: 0-No pain         Complications: No notable events documented.

## 2022-11-15 NOTE — Lactation Note (Signed)
This note was copied from a baby's chart. Lactation Consultation Note  Patient Name: Suzanne Cross [id number] Date: 11/15/2022 Age:32 hours Reason for consult: Initial assessment;Mother's request;Early term 37-38.6wks;Other (Comment) (Baby in SCN)   Maternal Data This is mom's 2nd baby, elective repeat C/S.Mom with history of anxiety, depression, obesity, infertility. Early term baby admitted to SCN with RDS.  Assisted mom with a brief trial of latching baby to the breast in SCN at 11:23 am however baby with desaturations to 83% and breastfeeding attempt was stopped. Care nurse in attendance at bedside. Assisted mom with breastpump initiation.  Has patient been taught Hand Expression?: Yes Does the patient have breastfeeding experience prior to this delivery?: Yes How long did the patient breastfeed?: 2 months.  Feeding Mother's Current Feeding Choice: Breast Milk and Donor Milk   Lactation Tools Discussed/Used Tools: 55F feeding tube / Syringe Breast pump type: Double-Electric Breast Pump Pump Education: Setup, frequency, and cleaning;Milk Storage;Other (comment) Reason for Pumping: Baby in SCN Pumping frequency: 8/24 hours Pumped volume:  (drops of colostrum)  Interventions Interventions: DEBP;Education  Discharge Pump: DEBP;Hands Free  Consult Status Consult Status: Follow-up Date: 11/16/22 Follow-up type: In-patient  Update provided to care nurse.  Fuller Song 11/15/2022, 7:12 PM

## 2022-11-15 NOTE — Anesthesia Preprocedure Evaluation (Signed)
Anesthesia Evaluation  Patient identified by MRN, date of birth, ID band Patient awake    Reviewed: Allergy & Precautions, NPO status , Patient's Chart, lab work & pertinent test results  History of Anesthesia Complications (+) PONV and history of anesthetic complications  Airway Mallampati: II  TM Distance: >3 FB Neck ROM: full    Dental  (+) Teeth Intact, Dental Advidsory Given   Pulmonary neg pulmonary ROS   Pulmonary exam normal breath sounds clear to auscultation       Cardiovascular Exercise Tolerance: Good negative cardio ROS Normal cardiovascular exam Rhythm:Regular     Neuro/Psych  PSYCHIATRIC DISORDERS  Depression    negative neurological ROS     GI/Hepatic negative GI ROS, Neg liver ROS,GERD  Medicated,,  Endo/Other  negative endocrine ROSneg diabetes    Renal/GU negative Renal ROS  negative genitourinary   Musculoskeletal   Abdominal  (+) + obese  Peds negative pediatric ROS (+)  Hematology negative hematology ROS (+) Blood dyscrasia, anemia   Anesthesia Other Findings Past Medical History: No date: Acid reflux disease No date: Anemia No date: Chocolate cyst of ovary     Comment:  Left No date: Complication of anesthesia     Comment:  LOW bp possible from propofol 2019, 2018: Ectopic pregnancy No date: Endometriosis No date: PONV (postoperative nausea and vomiting) No date: Scoliosis  Past Surgical History: 07/06/2018: CESAREAN SECTION; N/A     Comment:  Procedure: CESAREAN SECTION;  Surgeon: Leola Brazil,               MD;  Location: [facility] ORS;  Service: Obstetrics;                Laterality: N/A; 10/19/2020: COLONOSCOPY WITH PROPOFOL; N/A     Comment:  Procedure: COLONOSCOPY WITH PROPOFOL;  Surgeon: Wyline Mood, MD;  Location: [facility] ENDOSCOPY;  Service:               Gastroenterology;  Laterality: N/A; 06/02/2017: DIAGNOSTIC LAPAROSCOPY WITH REMOVAL OF ECTOPIC PREGNANCY;   Left     Comment:  Procedure: DIAGNOSTIC LAPAROSCOPY WITH REMOVAL OF               ECTOPIC PREGNANCY;  Surgeon: Ward, Elenora Fender, MD;                Location: [facility] ORS;  Service: Gynecology;  Laterality:               Left; 02/2016: ECTOPIC PREGNANCY SURGERY 10/19/2020: ESOPHAGOGASTRODUODENOSCOPY; N/A     Comment:  Procedure: ESOPHAGOGASTRODUODENOSCOPY (EGD);  Surgeon:               Wyline Mood, MD;  Location: [facility] ENDOSCOPY;  Service:               Gastroenterology;  Laterality: N/A;  Patient is allergic               to Propofol 02/05/2021: HYSTEROSCOPY; N/A     Comment:  Procedure: Hysteroscopy ;  Surgeon: Christeen Douglas,               MD;  Location: [facility] ORS;  Service: Gynecology;                Laterality: N/A; 06/02/2017: LAPAROTOMY; N/A     Comment:  Procedure: EXPLORATORY LAPAROTOMY;  Surgeon: Ward,               Elenora Fender,  MD;  Location: [facility] ORS;  Service: Gynecology;              Laterality: N/A; 09/2015: LAPAROTOMY; Left 08/2016: LAPAROTOMY; Left  BMI    Body Mass Index: 33.84 kg/m      Reproductive/Obstetrics negative OB ROS                             Anesthesia Physical Anesthesia Plan  ASA: 2  Anesthesia Plan: Spinal   Post-op Pain Management:    Induction: Intravenous  PONV Risk Score and Plan: 4 or greater and Ondansetron and Dexamethasone  Airway Management Planned:   Additional Equipment:   Intra-op Plan:   Post-operative Plan:   Informed Consent: I have reviewed the patients History and Physical, chart, labs and discussed the procedure including the risks, benefits and alternatives for the proposed anesthesia with the patient or authorized representative who has indicated his/her understanding and acceptance.     Dental Advisory Given  Plan Discussed with: CRNA and Surgeon  Anesthesia Plan Comments:         Anesthesia Quick Evaluation

## 2022-11-15 NOTE — Op Note (Signed)
  Cesarean Section Procedure Note  Date of procedure: 11/15/2022   Pre-operative Diagnosis: Intrauterine pregnancy at [redacted]w[redacted]d;  - prior myometrial incision - Body mass index is 37.1 kg/m.  Post-operative Diagnosis: same, delivered. Moderate midline adhesions  Procedure: Repeat Low Transverse Cesarean Section through Pfannenstiel incision  Surgeon: Christeen Douglas, MD  Assistant(s):  Erma Pinto Schermerhorn, MD  An experienced assistant was required given the standard of surgical care given the complexity of the case.  This assistant was needed for exposure, dissection, suctioning, retraction, instrument exchange,  MD assisting with delivery with administration of fundal pressure, visualization and for overall help during the procedure.   Anesthesia: Spinal anesthesia  Anesthesiologist: Lenard Simmer, MD Anesthesiologist: Lenard Simmer, MD CRNA: Jaye Beagle, CRNA  Estimated Blood Loss:           Drains: Foley         Total IV Fluids:  Urine Output:         Specimens: none         Complications:  None; patient tolerated the procedure well.         Disposition: PACU - hemodynamically stable.         Condition: stable  Findings:  A female infant in cephalic presentation. Amniotic fluid - Clear  Birth weight 2710 g.   APGAR (1 MIN): 8  APGAR (5 MINS): 9  APGAR (10 MINS):     Intact placenta with a three-vessel cord.  Grossly normal uterus, tubes and ovaries bilaterally. Moderate midline intraabdominal adhesions were noted.  Indications: Prior myometrial incision, prior cesarean section  Procedure Details  The patient was taken to Operating Room, identified as the correct patient and the procedure verified as C-Section Delivery. A formal Time Out was held with all team members present and in agreement.  After induction of anesthesia, the patient was draped and prepped in the usual sterile manner. A Pfannenstiel skin incision was made and  carried down through the subcutaneous tissue to the fascia. Fascial incision was made and extended transversely with the Mayo scissors. The fascia was separated from the underlying rectus tissue superiorly and inferiorly. The peritoneum was identified and entered bluntly. Peritoneal incision was extended longitudinally. The utero-vesical peritoneal reflection was incised transversely and a bladder flap was created digitally.   A low transverse hysterotomy was made. The fetus was delivered atraumatically. The umbilical cord was clamped x2 and cut and the infant was handed to the awaiting pediatricians. The placenta was removed intact and appeared normal, intact, and with a 3-vessel cord.   The uterus was exteriorized and cleared of all clot and debris. The hysterotomy was closed with running sutures of 0-Vicryl. A second imbricating layer was placed with the same suture. Excellent hemostasis was observed. The peritoneal cavity was cleared of all clots and debris. The uterus was returned to the abdomen.   Gutters and pelvis were evaluated and excellent hemostasis was noted. The fascia was then reapproximated with running sutures of 0 Maxon.  The subcutaneous tissue was reapproximated with running sutures of 0 Vicryl. The skin was reapproximated with Ensorb absorbable staples. 30 of 0.5% bupivicaine was placed in the fascial and skin lines.  Instrument, sponge, and needle counts were correct prior to the abdominal closure and at the conclusion of the case.   The patient tolerated the procedure well and was transferred to the recovery room in stable condition.   Christeen Douglas, MD 11/15/2022

## 2022-11-16 ENCOUNTER — Encounter: Payer: Self-pay | Admitting: Obstetrics and Gynecology

## 2022-11-16 LAB — CBC
HCT: 26.4 % — ABNORMAL LOW (ref 36.0–46.0)
Hemoglobin: 8.8 g/dL — ABNORMAL LOW (ref 12.0–15.0)
MCH: 28.4 pg (ref 26.0–34.0)
MCHC: 33.3 g/dL (ref 30.0–36.0)
MCV: 85.2 fL (ref 80.0–100.0)
Platelets: 193 10*3/uL (ref 150–400)
RBC: 3.1 MIL/uL — ABNORMAL LOW (ref 3.87–5.11)
RDW: 13.6 % (ref 11.5–15.5)
WBC: 10.9 10*3/uL — ABNORMAL HIGH (ref 4.0–10.5)
nRBC: 0 % (ref 0.0–0.2)

## 2022-11-16 LAB — RPR: RPR Ser Ql: NONREACTIVE

## 2022-11-16 MED ORDER — FERROUS SULFATE 325 (65 FE) MG PO TABS
325.0000 mg | ORAL_TABLET | Freq: Two times a day (BID) | ORAL | Status: DC
  Administered 2022-11-17 – 2022-11-18 (×3): 325 mg via ORAL
  Filled 2022-11-16 (×3): qty 1

## 2022-11-16 MED ORDER — SODIUM CHLORIDE 0.9 % IV SOLN
300.0000 mg | Freq: Once | INTRAVENOUS | Status: AC
  Administered 2022-11-16: 300 mg via INTRAVENOUS
  Filled 2022-11-16: qty 300

## 2022-11-16 MED ORDER — SODIUM CHLORIDE 0.9 % IV SOLN: INTRAVENOUS | Status: DC | PRN

## 2022-11-16 NOTE — Progress Notes (Signed)
Post Partum Day 1  Subjective: She reports passing gas but feels occasional gas pain   Doing well. Ambulating without difficulty, pain managed with PO meds, tolerating regular diet, and voiding without difficulty.   No fever/chills, chest pain, shortness of breath, nausea/vomiting, or leg pain. No nipple or breast pain. No headache, visual changes, or RUQ/epigastric pain.  Objective: BP 103/64 (BP Location: Left Arm)   Pulse 72   Temp 98.5 F (36.9 C) (Oral)   Resp 18   Ht 5\' 2"  (1.575 m)   Wt 92 kg   LMP 03/06/2021   SpO2 100%   Breastfeeding Unknown   BMI 37.10 kg/m    Physical Exam:  General: alert and cooperative Breasts: soft/nontender CV: RRR Pulm: nl effort, CTABL Abdomen: soft, non-tender, active bowel sounds Uterine Fundus: firm Incision: no drainage noted on pressure dressing Perineum: intact Lochia: appropriate DVT Evaluation: No evidence of DVT seen on physical exam. Edinburgh:     11/15/2022    9:00 PM 07/06/2018   11:46 PM  Edinburgh Postnatal Depression Scale Screening Tool  I have been able to laugh and see the funny side of things. 0 0  I have looked forward with enjoyment to things. 0 0  I have blamed myself unnecessarily when things went wrong. 1 1  I have been anxious or worried for no good reason. 0 1  I have felt scared or panicky for no good reason. 0 1  Things have been getting on top of me. 0 0  I have been so unhappy that I have had difficulty sleeping. 0 0  I have felt sad or miserable. 0 0  I have been so unhappy that I have been crying. 0 0  The thought of harming myself has occurred to me. 0 0  Edinburgh Postnatal Depression Scale Total 1 3     Recent Labs    11/14/22 0824 11/16/22 0518  HGB 12.1 8.8*  HCT 35.8* 26.4*  WBC 8.7 10.9*  PLT 257 193    Assessment/Plan: 32 y.o. [id number] postpartum day # 1  1. Continue routine postpartum care  2. Infant feeding status: expressed breast milk for baby in SCN -Lactation consult  PRN for breastfeeding   3. Contraception plan:  TBD  4. Acute blood loss anemia - clinically significant.  -Asymptomatic -Intervention: IV iron transfusion with venofer given , got most before IV infiltrated -Will repeat CBC in the am   5. Immunization status:   all immunizations up to date   Disposition: Continue inpatient postpartum care    LOS: 1 day   Yuliya Nova, CNM 11/16/2022, 12:26 PM

## 2022-11-16 NOTE — Anesthesia Postprocedure Evaluation (Signed)
Anesthesia Post Note  Patient: Suzanne Cross  Procedure(s) Performed: REPEAT CESAREAN SECTION  Patient location during evaluation: Mother Baby Anesthesia Type: Spinal Level of consciousness: oriented and awake and alert Pain management: pain level controlled Vital Signs Assessment: post-procedure vital signs reviewed and stable Respiratory status: spontaneous breathing and respiratory function stable Cardiovascular status: blood pressure returned to baseline and stable Postop Assessment: no headache, no backache, no apparent nausea or vomiting and patient able to bend at knees Anesthetic complications: no  No notable events documented.   Last Vitals:  Vitals:   11/15/22 1942 11/15/22 2328  BP: 107/69 103/60  Pulse: 84 72  Resp: 18 18  Temp:  37 C  SpO2: 98% 100%    Last Pain:  Vitals:   11/15/22 2328  TempSrc: Oral  PainSc:                  Elmarie Mainland

## 2022-11-16 NOTE — Lactation Note (Signed)
This note was copied from a baby's chart. Lactation Consultation Note  Patient Name: Suzanne Cross [id number] Date: 11/16/2022 Age:32 years Reason for consult: Follow-up assessment;Early term 37-38.6wks;NICU baby   Maternal Data Follow up assessment w/ 26hr old baby Suzanne.  Infant currently in SCN.  Parents visiting infant upon this visit.  MOB stated that she has been pumping q3h and only seeing a drop of colostrum.    Feeding Mother's Current Feeding Choice: Breast Milk and Donor Milk  Lactation Tools Discussed/Used Tools: Pump;68F feeding tube / Syringe Breast pump type: Double-Electric Breast Pump Pump Education: Setup, frequency, and cleaning Reason for Pumping: Infant in SCN & not going to breast as of now. Pumping frequency: q3  MOB pumped in the presence of the LC.  LC went down to a 21mm flange for the left breast, mom stated she noticed a change in the pull of the nipple .  At the end of the pump session more colostrum was seen in the flange from both breast.    Interventions Interventions: Education;CDC Guidelines for Breast Pump Cleaning  Time for MOB to pump during this visit.  A DEBP kit has been brought to infant bed space 02.  LC informed mom she could pump while visiting infant.   Plan is for mom to continue pumping every 3hrs.  Lactation is present to answer any questions throughout the day.  Consult Status Consult Status: Follow-up Follow-up type: In-patient    Suzanne Cross Free 11/16/2022, 11:24 AM

## 2022-11-17 LAB — CBC WITH DIFFERENTIAL/PLATELET
Abs Immature Granulocytes: 0.11 10*3/uL — ABNORMAL HIGH (ref 0.00–0.07)
Basophils Absolute: 0 10*3/uL (ref 0.0–0.1)
Basophils Relative: 1 %
Eosinophils Absolute: 0.2 10*3/uL (ref 0.0–0.5)
Eosinophils Relative: 2 %
HCT: 26.2 % — ABNORMAL LOW (ref 36.0–46.0)
Hemoglobin: 8.6 g/dL — ABNORMAL LOW (ref 12.0–15.0)
Immature Granulocytes: 1 %
Lymphocytes Relative: 32 %
Lymphs Abs: 2.4 10*3/uL (ref 0.7–4.0)
MCH: 28.2 pg (ref 26.0–34.0)
MCHC: 32.8 g/dL (ref 30.0–36.0)
MCV: 85.9 fL (ref 80.0–100.0)
Monocytes Absolute: 0.5 10*3/uL (ref 0.1–1.0)
Monocytes Relative: 6 %
Neutro Abs: 4.5 10*3/uL (ref 1.7–7.7)
Neutrophils Relative %: 58 %
Platelets: 178 10*3/uL (ref 150–400)
RBC: 3.05 MIL/uL — ABNORMAL LOW (ref 3.87–5.11)
RDW: 13.9 % (ref 11.5–15.5)
WBC: 7.7 10*3/uL (ref 4.0–10.5)
nRBC: 0 % (ref 0.0–0.2)

## 2022-11-17 NOTE — Progress Notes (Signed)
Postop Day  2  Subjective: 32 y.o. [id number] postop #2 status post repeat cesarean section. She is ambulating, is tolerating po, is voiding without difficulty.  Her pain is well controlled on PO pain medications.   No fever/chills, chest pain, shortness of breath, nausea/vomiting, or leg pain. No nipple or breast pain. No headache, visual changes, or RUQ/epigastric pain.   Objective:  BP (!) 106/57 (BP Location: Left Arm)   Pulse 79   Temp 98.2 F (36.8 C) (Oral)   Resp 18   Ht 5\' 2"  (1.575 m)   Wt 92 kg   LMP 03/06/2021   SpO2 99%   Breastfeeding  BMI 37.10 kg/m   Physical Exam:  General: alert, cooperative, appears stated age, and no distress Breasts: soft/nontender Pulm: nl effort Abdomen: soft, non-tender, active bowel sounds Uterine Fundus: firm Incision: honeycomb dressing intact, no drainage noted on honeycomb dressing, no erythema noted around incision site  Perineum: intact Lochia: appropriate DVT Evaluation: No evidence of DVT seen on physical exam. Negative Homan's sign. No cords or calf tenderness. No significant calf/ankle edema.  Recent Labs    11/16/22 0518 11/17/22 0647  HGB 8.8* 8.6*  HCT 26.4* 26.2*  WBC 10.9* 7.7  PLT 193 178    Assessment/Plan: 32 y.o. [id number] postop # 2  1. Continue routine postpartum care  2. Infant feeding status: breast feeding and expressed breast milk -Lactation consult PRN for breastfeeding   3. Contraception plan: no method  4. Acute blood loss anemia - clinically significant.  -Asymptomatic -Intervention: continue on oral supplementation with ferrous sulfate 325; IV iron transfusion   with Venofer given on 11/16/2022.   5. Immunization status:   all immunizations up to date  6.  Abdominal binder ordered due to patient's expression of feeling pressure when standing.       Abdominal binder provided to patient to help support abdominal muscles, for pain       management, and to promote healing after cesarean  birth.   Disposition: continue inpatient postpartum care , plan for discharge home tomorrow    LOS: 2 days   Elana Jian, CNM 11/17/2022, 9:22 AM   ----- Roney Jaffe Certified Nurse Midwife [facility]

## 2022-11-17 NOTE — Discharge Instructions (Signed)
Discharge Instructions:   If there are any new medications, they have been ordered and will be available for pickup at the listed pharmacy on your way home from the hospital.   Call office if you have any of the following: headache, visual changes, fever >101.0 F, chills, shortness of breath, breast concerns, excessive vaginal bleeding, incision drainage or problems, leg pain or redness, depression or any other concerns. If you have vaginal discharge with an odor, let your doctor know.   It is normal to bleed for up to 6 weeks. You should not soak through more than 1 pad in 1 hour. If you have a blood clot larger than your fist with continued bleeding, call your doctor.   After a c-section, you should expect a small amount of blood or clear fluid coming from the incision and abdominal cramping/soreness. Inspect your incision site daily. Stand in front of a mirror to look for any redness, incision opening, or discolored/odorness drainage. Take a shower daily and continue good hygiene. Use own towel and washcloth (do not share). Make sure your sheets on your bed are clean. No pets sleeping around your incision site. Dressing will be removed at your postpartum visit. If the dressing does become wet or soiled underneath, it is okay to remove it.   Activity: Do not lift > 10 lbs for 6 weeks (do not lift anything heavier than your baby). No intercourse, tampons, swimming pools, hot tubs, baths (only showers) for 6 weeks.  No driving for 1-2 weeks. Continue prenatal vitamin, especially if breastfeeding. Increase calories and fluids (water) while breastfeeding.   Your milk will come in, in the next couple of days (right now it is colostrum). You may have a slight fever when your milk comes in, but it should go away on its own.  If it does not, and rises above 101 F please call the doctor. You will also feel achy and your breasts will be firm. They will also start to leak. If you are breastfeeding, continue  as you have been and you can pump/express milk for comfort.   If you have too much milk, your breasts can become engorged, which could lead to mastitis. This is an infection of the milk ducts. It can be very painful and you will need to notify your doctor to obtain a prescription for antibiotics. You can also treat it with a shower or hot/cold compress.   For concerns about your baby, please call your pediatrician.  For breastfeeding concerns, the lactation consultant can be reached at [phone].   Postpartum blues (feelings of happy one minute and sad another minute) are normal for the first few weeks but if it gets worse let your doctor know.   Congratulations! We enjoyed caring for you and your new bundle of joy!

## 2022-11-17 NOTE — Lactation Note (Signed)
This note was copied from a baby's chart. Lactation Consultation Note  Patient Name: Suzanne Cross [id number] Date: 11/17/2022 Age:32 hours Reason for consult: Follow-up assessment;Early term 37-38.6wks   Maternal Data Follow up assessment w/ a 51hr old baby Suzanne.  Mom present in room and stated that she was about to start pumping. Mom asked for 18mm flanges because she noticed these flanges worked better when she is pumping. Mom also stated that her left breast is getting heavier and is a little sore in the upper quadrant.  Feeding Mother's Current Feeding Choice: Breast Milk and Donor Milk  Interventions Interventions: Breast feeding basics reviewed;Education;CDC Guidelines for Breast Pump Cleaning  LC assessed mom's breast.  Both breast feel a little heavier and slightly warm.  LC prepared mom a ice pack for her breast and had mom apply ice for prior to pumping. After pump session, mom stated her breast felt a lot better w/ ice and 18mm flanges.   Discharge Discharge Education: Outpatient recommendation;Engorgement and breast care  Education on engorgement prevention/treatment was discussed as well as breastmilk storage guidelines.  LC provided patient with a handout on breastmilk storage guidelines from [facility]. [facility] outpatient lactation services phone number written on the white board in the room.  Patient verbalized understanding   Consult Status Consult Status: Complete Follow-up type: Call as needed    [name] 11/17/2022, 11:23 AM

## 2022-11-18 NOTE — Discharge Summary (Signed)
Postpartum Discharge Summary  Patient Name: Suzanne Cross DOB: [birth year] MRN: [id number]  Date of admission: 11/15/2022 Delivery date:11/15/2022 Delivering provider: Christeen Douglas Date of discharge: 11/18/2022  Primary OB: [facility] [id number] last menstrual period was 03/06/2021. EDC Estimated Date of Delivery: 12/06/22 Gestational Age at Delivery: [redacted]w[redacted]d   Admitting diagnosis: Supervision of high risk pregnancy in third trimester [O09.93] Intrauterine pregnancy: [redacted]w[redacted]d     Secondary diagnosis:   Principal Problem:   Supervision of high risk pregnancy in third trimester Active Problems:   Obesity in pregnancy, antepartum   History of uterine scar from previous surgery   Cesarean delivery delivered   Discharge Diagnosis: Term Pregnancy Delivered                                                Post partum procedures: None Induction:: N/A Complications: None Delivery Type: repeat cesarean section, low transverse incision Anesthesia: spinal anesthesia Placenta: manual removal To Pathology: No  Laceration: n/a Episiotomy: none  Prenatal Labs:  ABO, Rh: --/--/A POS (10/14 9528) Antibody: NEG (10/14 0824) Rubella:  non immune RPR:   non reactive HBsAg:   neg HIV: Non Reactive (10/14 0824)  GBS:  pos 1 hr Glu 133 Genetic screening normal Anatomy US normal with MFM  Hospital course: Sceduled C/S   32 y.o. yo [id number] at [redacted]w[redacted]d was admitted to the hospital 11/15/2022 for scheduled cesarean section with the following indication: previous cesarean delivery and uterine incision .Delivery details are as follows:  Membrane Rupture Time/Date: 8:19 AM,11/15/2022  Delivery Method:C-Section, Low Transverse Operative Delivery:N/A Details of operation can be found in separate operative note.  Patient had a postpartum course complicated by none.  She is ambulating, tolerating a regular diet, passing flatus, and urinating well. Patient is discharged home in stable condition  on  11/18/22        Newborn Data: Birth date:11/15/2022 Birth time:8:20 AM Gender:Female Living status:Living Apgars:8 ,9  Weight:2710 g    Magnesium Sulfate received: No BMZ received: No Rhophylac:N/A MMR:N/A Varivax vaccine given: was not indicated T-DaP:Given prenatally Flu: Yes  Transfusion:No  Physical exam  Vitals:   11/16/22 2358 11/17/22 0719 11/17/22 1145 11/17/22 2302  BP: 112/66 (!) 106/57 116/63 120/71  Pulse: 95 79 85 95  Resp: 20 18 18 20   Temp: 98.6 F (37 C) 98.2 F (36.8 C) 98.1 F (36.7 C) 98.5 F (36.9 C)  TempSrc:  Oral Oral Oral  SpO2: 100% 99% 100% 99%  Weight:      Height:       General: alert, cooperative, and no distress Lochia: appropriate Uterine Fundus: firm Perineum:minimal edema/intact Incision: Healing well with no significant drainage, covered with occlusive OP site dressing  DVT Evaluation: No evidence of DVT seen on physical exam.  Labs: Lab Results  Component Value Date   WBC 7.7 11/17/2022   HGB 8.6 (L) 11/17/2022   HCT 26.2 (L) 11/17/2022   MCV 85.9 11/17/2022   PLT 178 11/17/2022      Latest Ref Rng & Units 10/14/2021    8:05 AM  CMP  Glucose 70 - 99 mg/dL 91   BUN 6 - 20 mg/dL 9   Creatinine 1.02 - 7.25 mg/dL 3.66   Sodium 440 - 347 mmol/L 140   Potassium 3.5 - 5.1 mmol/L 4.0   Chloride 98 - 111 mmol/L 106   CO2 22 -  32 mmol/L 26   Calcium 8.9 - 10.3 mg/dL 9.1    Edinburgh Score:    11/16/2022    9:00 PM  Edinburgh Postnatal Depression Scale Screening Tool  I have been able to laugh and see the funny side of things. 0  I have looked forward with enjoyment to things. 0  I have blamed myself unnecessarily when things went wrong. 2  I have been anxious or worried for no good reason. 1  I have felt scared or panicky for no good reason. 1  Things have been getting on top of me. 1  I have been so unhappy that I have had difficulty sleeping. 1  I have felt sad or miserable. 0  I have been so unhappy that I have  been crying. 1  The thought of harming myself has occurred to me. 0  Edinburgh Postnatal Depression Scale Total 7    Risk assessment for postpartum VTE and prophylactic treatment: Very high risk factors: None High risk factors: None Moderate risk factors: Cesarean delivery  and BMI 30-40 kg/m2  Postpartum VTE prophylaxis with LMWH not indicated  After visit meds:  Allergies as of 11/18/2022       Reactions   Propofol    Hypotension        Medication List     TAKE these medications    acetaminophen 500 MG tablet Commonly known as: TYLENOL Take 1,000 mg by mouth every 6 (six) hours as needed for mild pain.   DHA OMEGA 3 PO Take 1 tablet by mouth daily.   ibuprofen 800 MG tablet Commonly known as: ADVIL Take 800 mg by mouth every 8 (eight) hours.   oxyCODONE 5 MG immediate release tablet Commonly known as: Oxy IR/ROXICODONE Take 5 mg by mouth every 4 (four) hours as needed for severe pain (pain score 7-10).   prenatal multivitamin Tabs tablet Take 1 tablet by mouth daily at 12 noon.       Discharge home in stable condition Infant Feeding: Bottle and Breast Infant Disposition:NICU Discharge instruction: per After Visit Summary and Postpartum booklet. Activity: Advance as tolerated. Pelvic rest for 6 weeks.  Diet: routine diet Anticipated Birth Control: Unsure Postpartum Appointment:6 weeks Additional Postpartum F/U: Incision check 2 weeks  Future Appointments:No future appointments. Follow up Visit:  Follow-up Information     Christeen Douglas, MD Follow up in 2 week(s).   Specialty: Obstetrics and Gynecology Why: For postop check Contact information: [address] [place] [address] [phone]                 Plan:  Suzanne Cross was discharged to home in good condition. Follow-up appointment as directed.    Signed:  Margaretmary Eddy, CNM Certified Nurse Midwife [facility] OB[facility]

## 2022-11-18 NOTE — Plan of Care (Signed)
CHL Tonsillectomy/Adenoidectomy, Postoperative PEDS care plan entered in error.

## 2022-11-18 NOTE — Progress Notes (Signed)
Patient discharged home. Discharge instructions and prescriptions given and reviewed with patient. Patient verbalized understanding. Escorted out by auxillary.  

## 2022-11-19 ENCOUNTER — Ambulatory Visit: Payer: Self-pay

## 2022-11-19 NOTE — Lactation Note (Signed)
This note was copied from a baby's chart. Mom using a [employer] double electric pump at home, she feels the DEBP Symphony is more effective, she would like to rent a breast pump, Symphony pump #[id number] rented x 1 mth and $89.39 rental fees processed by credit card.  Receipt and rental agreement copy given to mom

## 2023-05-25 ENCOUNTER — Ambulatory Visit: Admitting: Podiatry

## 2023-07-13 ENCOUNTER — Ambulatory Visit: Admitting: Nurse Practitioner

## 2024-02-26 ENCOUNTER — Encounter: Payer: Self-pay | Admitting: Intensive Care

## 2024-02-26 ENCOUNTER — Emergency Department
Admission: EM | Admit: 2024-02-26 | Discharge: 2024-02-26 | Disposition: A | Discharge status: Home / Self Care | Attending: Emergency Medicine | Admitting: Emergency Medicine

## 2024-02-26 ENCOUNTER — Other Ambulatory Visit: Payer: Self-pay

## 2024-02-26 DIAGNOSIS — R109 Unspecified abdominal pain: Secondary | ICD-10-CM

## 2024-02-26 DIAGNOSIS — R1010 Upper abdominal pain, unspecified: Secondary | ICD-10-CM | POA: Insufficient documentation

## 2024-02-26 LAB — COMPREHENSIVE METABOLIC PANEL WITH GFR
ALT: 29 U/L (ref 0–44)
AST: 22 U/L (ref 15–41)
Albumin: 4.4 g/dL (ref 3.5–5.0)
Alkaline Phosphatase: 53 U/L (ref 38–126)
Anion gap: 14 (ref 5–15)
BUN: <5 mg/dL — ABNORMAL LOW (ref 6–20)
CO2: 21 mmol/L — ABNORMAL LOW (ref 22–32)
Calcium: 9.3 mg/dL (ref 8.9–10.3)
Chloride: 102 mmol/L (ref 98–111)
Creatinine, Ser: 0.62 mg/dL (ref 0.44–1.00)
GFR, Estimated: >60 mL/min
Glucose, Bld: 89 mg/dL (ref 70–99)
Potassium: 3.5 mmol/L (ref 3.5–5.1)
Sodium: 137 mmol/L (ref 135–145)
Total Bilirubin: 1 mg/dL (ref 0.0–1.2)
Total Protein: 7.4 g/dL (ref 6.5–8.1)

## 2024-02-26 LAB — URINALYSIS, ROUTINE W REFLEX MICROSCOPIC
Bilirubin Urine: NEGATIVE
Glucose, UA: NEGATIVE mg/dL
Ketones, ur: 20 mg/dL — AB
Leukocytes,Ua: NEGATIVE
Nitrite: NEGATIVE
Protein, ur: 30 mg/dL — AB
Specific Gravity, Urine: 1.027 (ref 1.005–1.030)
WBC, UA: 0 WBC/hpf (ref 0–5)
pH: 5 (ref 5.0–8.0)

## 2024-02-26 LAB — CBC
HCT: 41.3 % (ref 36.0–46.0)
Hemoglobin: 14.2 g/dL (ref 12.0–15.0)
MCH: 29 pg (ref 26.0–34.0)
MCHC: 34.4 g/dL (ref 30.0–36.0)
MCV: 84.5 fL (ref 80.0–100.0)
Platelets: 284 10*3/uL (ref 150–400)
RBC: 4.89 MIL/uL (ref 3.87–5.11)
RDW: 13.1 % (ref 11.5–15.5)
WBC: 6.6 10*3/uL (ref 4.0–10.5)
nRBC: 0 % (ref 0.0–0.2)

## 2024-02-26 LAB — RESP PANEL BY RT-PCR (RSV, FLU A&B, COVID)  RVPGX2
Influenza A by PCR: NEGATIVE
Influenza B by PCR: NEGATIVE
Resp Syncytial Virus by PCR: NEGATIVE
SARS Coronavirus 2 by RT PCR: NEGATIVE

## 2024-02-26 LAB — LIPASE, BLOOD: Lipase: 37 U/L (ref 11–51)

## 2024-02-26 LAB — POC URINE PREG, ED: Preg Test, Ur: NEGATIVE

## 2024-02-26 MED ORDER — DICYCLOMINE HCL 10 MG PO CAPS: 10.0000 mg | ORAL_CAPSULE | Freq: Three times a day (TID) | ORAL | 0 refills | Status: AC | PRN

## 2024-02-26 MED ORDER — METOCLOPRAMIDE HCL 5 MG/ML IJ SOLN
10.0000 mg | Freq: Once | INTRAMUSCULAR | Status: AC
  Administered 2024-02-26: 10 mg via INTRAVENOUS
  Filled 2024-02-26: qty 2

## 2024-02-26 MED ORDER — ONDANSETRON 4 MG PO TBDP: 4.0000 mg | ORAL_TABLET | Freq: Three times a day (TID) | ORAL | 0 refills | Status: AC | PRN

## 2024-02-26 MED ORDER — SODIUM CHLORIDE 0.9 % IV BOLUS
1000.0000 mL | Freq: Once | INTRAVENOUS | Status: AC
  Administered 2024-02-26: 1000 mL via INTRAVENOUS

## 2024-02-26 MED ORDER — LIDOCAINE VISCOUS HCL 2 % MT SOLN
15.0000 mL | Freq: Once | OROMUCOSAL | Status: AC
  Administered 2024-02-26: 15 mL via ORAL
  Filled 2024-02-26: qty 15

## 2024-02-26 MED ORDER — ALUM & MAG HYDROXIDE-SIMETH 200-200-20 MG/5ML PO SUSP
30.0000 mL | Freq: Once | ORAL | Status: AC
  Administered 2024-02-26: 30 mL via ORAL
  Filled 2024-02-26: qty 30

## 2024-02-26 NOTE — ED Notes (Signed)
 PT has approx 200cc NS left in ordered 1L NS bag that she would like to receive before DC. Pt also attempting to drink sprite to see if she can keep PO fluids down.

## 2024-02-26 NOTE — ED Provider Notes (Signed)
 "  [facility] Provider Note    Event Date/Time   First MD Initiated Contact with Patient 02/26/24 1252     (approximate)   History   Abdominal Pain   HPI  Suzanne Cross is a 34 y.o. female who presents to the emergency department today with concerns for abdominal pain.  The symptoms started yesterday when the patient was sleeping. she complains primarily of a cramping sensation in her upper abdomen.  This has been accompanied by multiple episodes of diarrhea.  She also has had nausea.  She now also complains of burning sensation in her chest.  Patient states that the pain seems to be getting somewhat better although does come and go in waves.  She has had similar pain twice in the past although much shorter lived.     Physical Exam   Triage Vital Signs: ED Triage Vitals  Encounter Vitals Group     BP 02/26/24 1213 117/87     Girls Systolic BP Percentile --      Girls Diastolic BP Percentile --      Boys Systolic BP Percentile --      Boys Diastolic BP Percentile --      Pulse Rate 02/26/24 1213 (!) 126     Resp 02/26/24 1213 18     Temp 02/26/24 1213 98.2 F (36.8 C)     Temp Source 02/26/24 1213 Oral     SpO2 02/26/24 1213 100 %     Weight 02/26/24 1211 157 lb (71.2 kg)     Height 02/26/24 1211 5' 2 (1.575 m)     Head Circumference --      Peak Flow --      Pain Score 02/26/24 1211 4     Pain Loc --      Pain Education --      Exclude from Growth Chart --     Most recent vital signs: Vitals:   02/26/24 1213  BP: 117/87  Pulse: (!) 126  Resp: 18  Temp: 98.2 F (36.8 C)  SpO2: 100%   General: Awake, alert, oriented. CV:  Good peripheral perfusion. Regular rate and rhythm. Resp:  Normal effort. Lungs clear. Abd:  No distention. Somewhat diffusely tender to palpation.   ED Results / Procedures / Treatments   Labs (all labs ordered are listed, but only abnormal results are displayed) Labs Reviewed  COMPREHENSIVE METABOLIC PANEL  WITH GFR - Abnormal; Notable for the following components:      Result Value   CO2 21 (*)    BUN <5 (*)    All other components within normal limits  RESP PANEL BY RT-PCR (RSV, FLU A&B, COVID)  RVPGX2  LIPASE, BLOOD  CBC  URINALYSIS, ROUTINE W REFLEX MICROSCOPIC  POC URINE PREG, ED     EKG  None   RADIOLOGY None   PROCEDURES:  Critical Care performed: No   MEDICATIONS ORDERED IN ED: Medications - No data to display   IMPRESSION / MDM / ASSESSMENT AND PLAN / ED COURSE  I reviewed the triage vital signs and the nursing notes.                              Differential diagnosis includes, but is not limited to, pancreatitis, gastritis, gastroenteritis, gallbladder disease  Patient's presentation is most consistent with acute presentation with potential threat to life or bodily function.   Patient presented to the emergency department today  because of concerns for abdominal pain.  On exam she is somewhat diffusely tender to palpation. Blood work without leukocytosis. She did feel significant improvement after medication. At this time will plan on discharging with further medication to help with likely gastroenteritis.      FINAL CLINICAL IMPRESSION(S) / ED DIAGNOSES   Final diagnoses:  Abdominal pain, unspecified abdominal location        Rx / DC Orders   ED Discharge Orders     None        Note:  This document was prepared using Dragon voice recognition software and may include unintentional dictation errors.    Floy Roberts, MD 02/26/24 1457  "

## 2024-02-26 NOTE — ED Notes (Signed)
 Pt able to keep down PO fluids; per request pt finishing IVF, approx 100cc left in bag.

## 2024-02-26 NOTE — ED Triage Notes (Signed)
 Since 4:30pm yesterday, has had severe abdominal pain with diarrhea. Reports nausea/dry heaving.

## 2024-02-26 NOTE — ED Notes (Signed)
 Pt verbalizes understanding of discharge instructions. Opportunity for questioning and answers were provided. Pt discharged from ED with friend/family at this time. Pt ambulatory.
